# Patient Record
Sex: Female | Born: 1965 | Race: White | Hispanic: No | Marital: Married | Smoking: Never smoker
Health system: Southern US, Community
[De-identification: ages and names within clinical notes are randomized; demographics above are authoritative.]

## PROBLEM LIST (undated history)

## (undated) DIAGNOSIS — F419 Anxiety disorder, unspecified: Secondary | ICD-10-CM

## (undated) DIAGNOSIS — J309 Allergic rhinitis, unspecified: Secondary | ICD-10-CM

## (undated) DIAGNOSIS — I10 Essential (primary) hypertension: Secondary | ICD-10-CM

## (undated) DIAGNOSIS — E785 Hyperlipidemia, unspecified: Secondary | ICD-10-CM

## (undated) DIAGNOSIS — G4730 Sleep apnea, unspecified: Secondary | ICD-10-CM

## (undated) HISTORY — DX: Sleep apnea, unspecified: G47.30

## (undated) HISTORY — DX: Allergic rhinitis, unspecified: J30.9

## (undated) HISTORY — DX: Anxiety disorder, unspecified: F41.9

## (undated) HISTORY — DX: Essential (primary) hypertension: I10

## (undated) HISTORY — DX: Hyperlipidemia, unspecified: E78.5

## (undated) HISTORY — PX: DILATION AND CURETTAGE OF UTERUS: SHX78

---

## 2000-08-28 ENCOUNTER — Inpatient Hospital Stay (HOSPITAL_COMMUNITY): Admission: AD | Admit: 2000-08-28 | Discharge: 2000-08-31 | Payer: Self-pay | Admitting: Obstetrics and Gynecology

## 2006-11-01 ENCOUNTER — Ambulatory Visit (HOSPITAL_COMMUNITY): Admission: RE | Admit: 2006-11-01 | Discharge: 2006-11-01 | Payer: Self-pay | Admitting: Family Medicine

## 2006-11-16 ENCOUNTER — Ambulatory Visit (HOSPITAL_COMMUNITY): Admission: RE | Admit: 2006-11-16 | Discharge: 2006-11-16 | Payer: Self-pay | Admitting: Family Medicine

## 2008-01-29 ENCOUNTER — Ambulatory Visit (HOSPITAL_COMMUNITY): Admission: RE | Admit: 2008-01-29 | Discharge: 2008-01-29 | Payer: Self-pay | Admitting: Family Medicine

## 2009-01-29 ENCOUNTER — Ambulatory Visit (HOSPITAL_COMMUNITY): Admission: RE | Admit: 2009-01-29 | Discharge: 2009-01-29 | Payer: Self-pay | Admitting: Family Medicine

## 2010-02-03 ENCOUNTER — Ambulatory Visit (HOSPITAL_COMMUNITY)
Admission: RE | Admit: 2010-02-03 | Discharge: 2010-02-03 | Payer: Self-pay | Source: Home / Self Care | Attending: Family Medicine | Admitting: Family Medicine

## 2011-02-08 ENCOUNTER — Other Ambulatory Visit: Payer: Self-pay | Admitting: Family Medicine

## 2011-02-08 DIAGNOSIS — Z139 Encounter for screening, unspecified: Secondary | ICD-10-CM

## 2011-02-22 ENCOUNTER — Ambulatory Visit (HOSPITAL_COMMUNITY)
Admission: RE | Admit: 2011-02-22 | Discharge: 2011-02-22 | Disposition: A | Discharge status: Home / Self Care | Payer: BC Managed Care – PPO | Source: Ambulatory Visit | Attending: Family Medicine | Admitting: Family Medicine

## 2011-02-22 DIAGNOSIS — Z1231 Encounter for screening mammogram for malignant neoplasm of breast: Secondary | ICD-10-CM | POA: Insufficient documentation

## 2011-02-22 DIAGNOSIS — Z139 Encounter for screening, unspecified: Secondary | ICD-10-CM

## 2012-02-07 ENCOUNTER — Other Ambulatory Visit: Payer: Self-pay | Admitting: Family Medicine

## 2012-02-07 DIAGNOSIS — Z139 Encounter for screening, unspecified: Secondary | ICD-10-CM

## 2012-02-29 ENCOUNTER — Ambulatory Visit (HOSPITAL_COMMUNITY)
Admission: RE | Admit: 2012-02-29 | Discharge: 2012-02-29 | Disposition: A | Discharge status: Home / Self Care | Payer: BC Managed Care – PPO | Source: Ambulatory Visit | Attending: Family Medicine | Admitting: Family Medicine

## 2012-02-29 DIAGNOSIS — Z1231 Encounter for screening mammogram for malignant neoplasm of breast: Secondary | ICD-10-CM | POA: Insufficient documentation

## 2012-02-29 DIAGNOSIS — Z139 Encounter for screening, unspecified: Secondary | ICD-10-CM

## 2012-05-18 ENCOUNTER — Other Ambulatory Visit (HOSPITAL_COMMUNITY): Payer: Self-pay | Admitting: Family Medicine

## 2012-06-19 ENCOUNTER — Other Ambulatory Visit: Payer: Self-pay | Admitting: Family Medicine

## 2012-06-19 ENCOUNTER — Telehealth: Payer: Self-pay | Admitting: Nurse Practitioner

## 2012-06-19 DIAGNOSIS — G4733 Obstructive sleep apnea (adult) (pediatric): Secondary | ICD-10-CM

## 2012-06-19 NOTE — Telephone Encounter (Signed)
Patient would like to know if you could refer her for sleep study like you had talked about in the past.

## 2012-06-19 NOTE — Telephone Encounter (Signed)
Yes, i put the ball in motion, she will be calledd soon for aapt

## 2012-07-03 ENCOUNTER — Other Ambulatory Visit: Payer: Self-pay | Admitting: *Deleted

## 2012-07-03 DIAGNOSIS — G4730 Sleep apnea, unspecified: Secondary | ICD-10-CM

## 2012-07-04 ENCOUNTER — Other Ambulatory Visit (HOSPITAL_COMMUNITY): Payer: Self-pay | Admitting: Family Medicine

## 2012-07-05 NOTE — Telephone Encounter (Signed)
30 d only needs htn ov was supposed to see 6 mo ck up in [place]

## 2012-07-07 ENCOUNTER — Encounter: Payer: Self-pay | Admitting: *Deleted

## 2012-07-07 ENCOUNTER — Telehealth: Payer: Self-pay | Admitting: Family Medicine

## 2012-07-07 NOTE — Telephone Encounter (Signed)
Patient called to request a referral for a sleep study, however there's no documentation in paper or electronic chart as for the need of the sleep study or any symptoms. I left a message on patient's voicemail that she will need an office visit here to discuss concerns and need for sleep study before referral can be done.

## 2012-07-20 ENCOUNTER — Ambulatory Visit (INDEPENDENT_AMBULATORY_CARE_PROVIDER_SITE_OTHER): Payer: BC Managed Care – PPO | Admitting: Nurse Practitioner

## 2012-07-20 ENCOUNTER — Encounter: Payer: Self-pay | Admitting: Nurse Practitioner

## 2012-07-20 VITALS — BP 124/92 | HR 80 | Wt 175.2 lb

## 2012-07-20 DIAGNOSIS — Z79899 Other long term (current) drug therapy: Secondary | ICD-10-CM

## 2012-07-20 DIAGNOSIS — R5381 Other malaise: Secondary | ICD-10-CM

## 2012-07-20 DIAGNOSIS — E785 Hyperlipidemia, unspecified: Secondary | ICD-10-CM | POA: Insufficient documentation

## 2012-07-20 DIAGNOSIS — I10 Essential (primary) hypertension: Secondary | ICD-10-CM

## 2012-07-20 DIAGNOSIS — R5383 Other fatigue: Secondary | ICD-10-CM

## 2012-07-20 MED ORDER — FLUOXETINE HCL 20 MG PO TABS: ORAL_TABLET | ORAL | Status: DC

## 2012-07-20 MED ORDER — PRAVASTATIN SODIUM 20 MG PO TABS: 20.0000 mg | ORAL_TABLET | Freq: Every day | ORAL | Status: DC

## 2012-07-20 MED ORDER — AMLODIPINE BESYLATE 10 MG PO TABS: 10.0000 mg | ORAL_TABLET | Freq: Every day | ORAL | Status: DC

## 2012-07-20 NOTE — Progress Notes (Signed)
Subjective:  Presents for routine followup. Patient had an old CPAP machine that belonged to her father, used this for a period of time, noticed it greatly helped her energy. No further snoring. Had a mask with it which no longer had a good seal. Since stopping the CPAP has noticed her fatigue has increased as well as a report of loud snoring by her family. Is requesting a sleep study and possible CPAP. Compliant with medications. Has had increased stress lately, Prozac 20 mg does not seem to be working as well. No suicidal or homicidal thoughts or ideation. No chest pain shortness of breath. Minimal edema in the ankle area only when she's been outside with the heat.  Objective:   BP 124/92  Pulse 80  Wt 175 lb 3.2 oz (79.47 kg)  LMP 07/19/2012 NAD. Alert, oriented. Lungs clear. Heart regular rate rhythm.  Assessment:Hypertension - Plan: Basic metabolic panel, Basic metabolic panel  High risk medication use - Plan: Hepatic function panel, Hepatic function panel  Hyperlipemia - Plan: Lipid panel, Lipid panel  Other malaise and fatigue - Plan: TSH, Vitamin D 25 hydroxy, TSH, Vitamin D 25 hydroxy  Plan: Meds ordered this encounter  Medications  . DISCONTD: FLUoxetine (PROZAC) 20 MG tablet    Sig: Take 20 mg by mouth daily.  Marland Kitchen amLODipine (NORVASC) 10 MG tablet    Sig: Take 1 tablet (10 mg total) by mouth daily.    Dispense:  90 tablet    Refill:  1    Order Specific Question:  Supervising Provider    Answer:  Merlyn Albert [[id number]]  . FLUoxetine (PROZAC) 20 MG tablet    Sig: Take 2 capsules per day    Dispense:  180 tablet    Refill:  1    Order Specific Question:  Supervising Provider    Answer:  Merlyn Albert [[id number]]  . pravastatin (PRAVACHOL) 20 MG tablet    Sig: Take 1 tablet (20 mg total) by mouth daily.    Dispense:  90 tablet    Refill:  1    Order Specific Question:  Supervising Provider    Answer:  Merlyn Albert [[id number]]   Increase Prozac to 40 mg daily. Will look  into home base sleep study to see if this is an option, otherwise will set up sleep study at the sleep lab. Recheck in 3 months, recommend preventive health physical at that time.

## 2012-07-20 NOTE — Assessment & Plan Note (Signed)
Continue amlodipine 10 mg daily.

## 2012-07-20 NOTE — Assessment & Plan Note (Signed)
Continue pravastatin 20 mg daily. Lab work pending.

## 2012-07-24 ENCOUNTER — Other Ambulatory Visit: Payer: Self-pay | Admitting: Nurse Practitioner

## 2012-07-24 DIAGNOSIS — R0683 Snoring: Secondary | ICD-10-CM

## 2012-07-24 DIAGNOSIS — R5381 Other malaise: Secondary | ICD-10-CM

## 2012-08-06 ENCOUNTER — Ambulatory Visit: Payer: BC Managed Care – PPO | Attending: Family Medicine | Admitting: Sleep Medicine

## 2012-08-06 DIAGNOSIS — G4730 Sleep apnea, unspecified: Secondary | ICD-10-CM

## 2012-08-06 DIAGNOSIS — G4733 Obstructive sleep apnea (adult) (pediatric): Secondary | ICD-10-CM | POA: Insufficient documentation

## 2012-08-06 DIAGNOSIS — G4710 Hypersomnia, unspecified: Secondary | ICD-10-CM | POA: Insufficient documentation

## 2012-08-07 NOTE — Procedures (Signed)
[facility] Marin Milley A. Gerilyn Pilgrim, MD     [email or web address]        NAME:  Gabrielle Little, Gabrielle Little                ACCOUNT NO.:  [id number]  MEDICAL RECORD NO.:  [id number]          PATIENT TYPE:  OUT  LOCATION:  SLEEP LAB                     FACILITY:  [facility]  PHYSICIAN:  Rohith Fauth A. Gerilyn Pilgrim, M.D. DATE OF BIRTH:  August 09, 1965  DATE OF STUDY:                           NOCTURNAL POLYSOMNOGRAM  REFERRING PHYSICIAN:  Merlyn Albert  INDICATION:  A 47 year old, who presents with hypersomnia, fatigue, and snoring.  MEDICATIONS:  None.  EPWORTH SLEEPINESS SCALE:  10.  BMI:  30.  ARCHITECTURAL SUMMARY:  This is a split-night recording with initial portion being a diagnostic and the second portion a titration recording. The total recording time is 436, sleep efficiency 87%, sleep latency 18 minutes.  REM latency 123 minutes.  Stage N1 5%, N2 28%, N3 20%, and REM sleep 47%  RESPIRATORY SUMMARY:  Baseline oxygen saturation is 98, lowest saturation was 78 during REM sleep.  Diagnostic AHI is 51.  The patient was placed on positive pressures between 5 and 15, optimal pressure is 13 with resolution of events and good tolerance.  LIMB MOVEMENT SUMMARY:  PLM index 0.  ELECTROCARDIOGRAM SUMMARY:  Average heart rate is 76 with no significant dysrhythmias observed.  IMPRESSION:  Severe obstructive sleep apnea syndrome, which responds well to a continuous positive airway pressure of 13.  Thank you for this referral.     Nancylee Gaines A. Gerilyn Pilgrim, M.D.    KAD/MEDQ  D:  08/07/2012 09:02:30  T:  08/07/2012 09:23:59  Job:  [id number]

## 2012-08-18 LAB — LIPID PANEL
HDL: 53 mg/dL (ref >39)
LDL Cholesterol: 114 mg/dL — ABNORMAL HIGH (ref 0–99)
Total CHOL/HDL Ratio: 3.5 Ratio
VLDL: 21 mg/dL (ref 0–40)

## 2012-08-18 LAB — HEPATIC FUNCTION PANEL
Bilirubin, Direct: 0.2 mg/dL (ref 0.0–0.3)
Indirect Bilirubin: 0.6 mg/dL (ref 0.0–0.9)
Total Bilirubin: 0.8 mg/dL (ref 0.3–1.2)

## 2012-08-18 LAB — BASIC METABOLIC PANEL
BUN: 8 mg/dL (ref 6–23)
Creat: 0.65 mg/dL (ref 0.50–1.10)
Glucose, Bld: 89 mg/dL (ref 70–99)
Potassium: 4.2 mEq/L (ref 3.5–5.3)

## 2012-08-19 LAB — VITAMIN D 25 HYDROXY (VIT D DEFICIENCY, FRACTURES): Vit D, 25-Hydroxy: 33 ng/mL (ref 30–89)

## 2012-08-21 ENCOUNTER — Encounter: Payer: Self-pay | Admitting: Nurse Practitioner

## 2012-09-13 ENCOUNTER — Telehealth: Payer: Self-pay | Admitting: Family Medicine

## 2012-09-13 NOTE — Telephone Encounter (Signed)
Results on front of chart

## 2012-09-13 NOTE — Telephone Encounter (Signed)
Study shows very significant sleep apnea. Needs new machine and mask--see rx attached to chart. Carry to car [facility] to get the ball rolling.

## 2012-09-13 NOTE — Telephone Encounter (Signed)
Paitent is call wanting results from sleep study from 7/6 she states.She hadnt her anything yet.

## 2012-09-14 NOTE — Telephone Encounter (Signed)
Notified patient study shows very significant sleep apnea. Needs new machine and mask, RX upfront ready for pickup, Carry to car [facility] to get the ball rolling. Patient verbalized understanding.

## 2012-09-18 ENCOUNTER — Telehealth: Payer: Self-pay | Admitting: Family Medicine

## 2012-09-18 NOTE — Telephone Encounter (Signed)
I thought i wrote one, heres another

## 2012-09-18 NOTE — Telephone Encounter (Signed)
Pt needs a hand written script with the cpap setting written on it please, fax to [phone]

## 2012-09-18 NOTE — Telephone Encounter (Signed)
Order faxed to number provided

## 2012-10-20 ENCOUNTER — Ambulatory Visit: Payer: BC Managed Care – PPO | Admitting: Nurse Practitioner

## 2012-11-02 ENCOUNTER — Telehealth: Payer: Self-pay | Admitting: Family Medicine

## 2012-11-02 NOTE — Telephone Encounter (Signed)
Discussed with pt's husband.  

## 2012-11-02 NOTE — Telephone Encounter (Signed)
Has laryngitis and is coughing.  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  If you call in meds please call into [facility]

## 2012-11-02 NOTE — Telephone Encounter (Signed)
Laryngitis is almost always a viral illness. He typically takes 3-8 days to go away. He is starting to have high fevers chest congestion shortness of breath or significant wheezing then should be checked quickly here or ER. Allergy medication is fine but typically laryngitis has to run its course.

## 2012-12-21 ENCOUNTER — Ambulatory Visit (INDEPENDENT_AMBULATORY_CARE_PROVIDER_SITE_OTHER): Payer: BC Managed Care – PPO | Admitting: *Deleted

## 2012-12-21 DIAGNOSIS — Z23 Encounter for immunization: Secondary | ICD-10-CM

## 2013-01-22 ENCOUNTER — Ambulatory Visit (INDEPENDENT_AMBULATORY_CARE_PROVIDER_SITE_OTHER): Payer: BC Managed Care – PPO | Admitting: Nurse Practitioner

## 2013-01-22 ENCOUNTER — Encounter: Payer: Self-pay | Admitting: Nurse Practitioner

## 2013-01-22 VITALS — BP 138/94 | Ht 61.0 in | Wt 176.2 lb

## 2013-01-22 DIAGNOSIS — N938 Other specified abnormal uterine and vaginal bleeding: Secondary | ICD-10-CM

## 2013-01-22 DIAGNOSIS — I10 Essential (primary) hypertension: Secondary | ICD-10-CM

## 2013-01-22 DIAGNOSIS — E785 Hyperlipidemia, unspecified: Secondary | ICD-10-CM

## 2013-01-22 DIAGNOSIS — F329 Major depressive disorder, single episode, unspecified: Secondary | ICD-10-CM

## 2013-01-22 DIAGNOSIS — Z79899 Other long term (current) drug therapy: Secondary | ICD-10-CM

## 2013-01-22 DIAGNOSIS — N949 Unspecified condition associated with female genital organs and menstrual cycle: Secondary | ICD-10-CM

## 2013-01-22 MED ORDER — PRAVASTATIN SODIUM 20 MG PO TABS: 20.0000 mg | ORAL_TABLET | Freq: Every day | ORAL | Status: DC

## 2013-01-22 MED ORDER — AMLODIPINE BESYLATE 10 MG PO TABS: 10.0000 mg | ORAL_TABLET | Freq: Every day | ORAL | Status: DC

## 2013-01-22 MED ORDER — FLUOXETINE HCL 20 MG PO TABS: ORAL_TABLET | ORAL | Status: DC

## 2013-01-30 ENCOUNTER — Encounter: Payer: Self-pay | Admitting: Nurse Practitioner

## 2013-01-30 NOTE — Progress Notes (Signed)
Subjective:  Presents for routine followup. Weight has been stable. No chest pain shortness of breath or edema. Having irregular menstrual cycles usually very heavy for first 2 days. Is due for her preventive health physical. Depression stable on Prozac.  Objective:   BP 138/94  Ht 5\' 1"  (1.549 m)  Wt 176 lb 3.2 oz (79.924 kg)  BMI 33.31 kg/m2 NAD. Alert, oriented. Lungs clear. Heart regular rate rhythm. Lower extremities no edema.   Assessment:Hypertension  High risk medication use - Plan: Hepatic function panel  Hyperlipemia - Plan: Lipid panel  DUB (dysfunctional uterine bleeding) - Plan: CBC with Differential  Depression   Plan: Meds ordered this encounter  Medications  . amLODipine (NORVASC) 10 MG tablet    Sig: Take 1 tablet (10 mg total) by mouth daily.    Dispense:  90 tablet    Refill:  1    Order Specific Question:  Supervising Provider    Answer:  Merlyn Albert [[id number]]  . FLUoxetine (PROZAC) 20 MG tablet    Sig: Take 2 capsules per day    Dispense:  180 tablet    Refill:  1    Order Specific Question:  Supervising Provider    Answer:  Merlyn Albert [[id number]]  . pravastatin (PRAVACHOL) 20 MG tablet    Sig: Take 1 tablet (20 mg total) by mouth daily.    Dispense:  90 tablet    Refill:  1    Order Specific Question:  Supervising Provider    Answer:  Merlyn Albert [[id number]]   encouraged healthy diet regular activity and weight loss. Continue current medications as directed. Recheck in 3-4 months, reminded patient about wellness physical.

## 2013-01-30 NOTE — Assessment & Plan Note (Signed)
Medications  . amLODipine (NORVASC) 10 MG tablet    Sig: Take 1 tablet (10 mg total) by mouth daily.    Dispense:  90 tablet    Refill:  1    Order Specific Question:  Supervising Provider    Answer:  LUKING, WILLIAM S [[id number]]  . FLUoxetine (PROZAC) 20 MG tablet    Sig: Take 2 capsules per day    Dispense:  180 tablet    Refill:  1    Order Specific Question:  Supervising Provider    Answer:  LUKING, WILLIAM S [[id number]]  . pravastatin (PRAVACHOL) 20 MG tablet    Sig: Take 1 tablet (20 mg total) by mouth daily.    Dispense:  90 tablet    Refill:  1    Order Specific Question:  Supervising Provider    Answer:  LUKING, WILLIAM S [[id number]]   encouraged healthy diet regular activity and weight loss. Continue current medications as directed. Recheck in 3-4 months, reminded patient about wellness physical.  

## 2013-01-30 NOTE — Assessment & Plan Note (Signed)
Medications  . amLODipine (NORVASC) 10 MG tablet    Sig: Take 1 tablet (10 mg total) by mouth daily.    Dispense:  90 tablet    Refill:  1    Order Specific Question:  Supervising Provider    Answer:  Merlyn Albert [[id number]]  . FLUoxetine (PROZAC) 20 MG tablet    Sig: Take 2 capsules per day    Dispense:  180 tablet    Refill:  1    Order Specific Question:  Supervising Provider    Answer:  Merlyn Albert [[id number]]  . pravastatin (PRAVACHOL) 20 MG tablet    Sig: Take 1 tablet (20 mg total) by mouth daily.    Dispense:  90 tablet    Refill:  1    Order Specific Question:  Supervising Provider    Answer:  Merlyn Albert [[id number]]   encouraged healthy diet regular activity and weight loss. Continue current medications as directed. Recheck in 3-4 months, reminded patient about wellness physical.

## 2013-01-31 ENCOUNTER — Encounter: Payer: BC Managed Care – PPO | Admitting: Nurse Practitioner

## 2013-02-27 ENCOUNTER — Encounter: Payer: BC Managed Care – PPO | Admitting: Nurse Practitioner

## 2013-03-10 LAB — CBC WITH DIFFERENTIAL/PLATELET
Basophils Absolute: 0 10*3/uL (ref 0.0–0.1)
Basophils Relative: 0 % (ref 0–1)
Eosinophils Absolute: 0.2 10*3/uL (ref 0.0–0.7)
Eosinophils Relative: 2 % (ref 0–5)
HCT: 39.1 % (ref 36.0–46.0)
Hemoglobin: 13.3 g/dL (ref 12.0–15.0)
Lymphocytes Relative: 30 % (ref 12–46)
Lymphs Abs: 2.1 10*3/uL (ref 0.7–4.0)
MCH: 27.9 pg (ref 26.0–34.0)
MCHC: 34 g/dL (ref 30.0–36.0)
MCV: 82 fL (ref 78.0–100.0)
Monocytes Absolute: 0.6 10*3/uL (ref 0.1–1.0)
Monocytes Relative: 9 % (ref 3–12)
Neutro Abs: 4 10*3/uL (ref 1.7–7.7)
Neutrophils Relative %: 59 % (ref 43–77)
Platelets: 269 10*3/uL (ref 150–400)
RBC: 4.77 MIL/uL (ref 3.87–5.11)
RDW: 14.6 % (ref 11.5–15.5)
WBC: 6.8 10*3/uL (ref 4.0–10.5)

## 2013-03-10 LAB — LIPID PANEL
Cholesterol: 182 mg/dL (ref 0–200)
HDL: 63 mg/dL (ref >39)
LDL Cholesterol: 90 mg/dL (ref 0–99)
Total CHOL/HDL Ratio: 2.9 Ratio
Triglycerides: 145 mg/dL (ref <150)
VLDL: 29 mg/dL (ref 0–40)

## 2013-03-10 LAB — HEPATIC FUNCTION PANEL
ALT: 18 U/L (ref 0–35)
AST: 16 U/L (ref 0–37)
Albumin: 3.8 g/dL (ref 3.5–5.2)
Alkaline Phosphatase: 56 U/L (ref 39–117)
Bilirubin, Direct: 0.1 mg/dL (ref 0.0–0.3)
Indirect Bilirubin: 0.4 mg/dL (ref 0.2–1.2)
Total Bilirubin: 0.5 mg/dL (ref 0.2–1.2)
Total Protein: 6.2 g/dL (ref 6.0–8.3)

## 2013-03-12 ENCOUNTER — Encounter: Payer: BC Managed Care – PPO | Admitting: Nurse Practitioner

## 2013-03-12 ENCOUNTER — Encounter: Payer: Self-pay | Admitting: Nurse Practitioner

## 2013-04-03 ENCOUNTER — Encounter: Payer: BC Managed Care – PPO | Admitting: Nurse Practitioner

## 2013-04-24 ENCOUNTER — Other Ambulatory Visit: Payer: Self-pay | Admitting: Family Medicine

## 2013-04-24 DIAGNOSIS — Z1231 Encounter for screening mammogram for malignant neoplasm of breast: Secondary | ICD-10-CM

## 2013-05-07 ENCOUNTER — Encounter: Payer: BC Managed Care – PPO | Admitting: Nurse Practitioner

## 2013-05-10 ENCOUNTER — Ambulatory Visit (HOSPITAL_COMMUNITY)
Admission: RE | Admit: 2013-05-10 | Discharge: 2013-05-10 | Disposition: A | Discharge status: Home / Self Care | Payer: BC Managed Care – PPO | Source: Ambulatory Visit | Attending: Family Medicine | Admitting: Family Medicine

## 2013-05-10 DIAGNOSIS — Z1231 Encounter for screening mammogram for malignant neoplasm of breast: Secondary | ICD-10-CM | POA: Insufficient documentation

## 2013-05-14 ENCOUNTER — Encounter: Payer: Self-pay | Admitting: Nurse Practitioner

## 2013-05-14 ENCOUNTER — Ambulatory Visit (INDEPENDENT_AMBULATORY_CARE_PROVIDER_SITE_OTHER): Payer: BC Managed Care – PPO | Admitting: Nurse Practitioner

## 2013-05-14 VITALS — BP 134/96 | Ht 61.0 in | Wt 182.0 lb

## 2013-05-14 DIAGNOSIS — I10 Essential (primary) hypertension: Secondary | ICD-10-CM

## 2013-05-14 DIAGNOSIS — Z0000 Encounter for general adult medical examination without abnormal findings: Secondary | ICD-10-CM

## 2013-05-14 DIAGNOSIS — Z01419 Encounter for gynecological examination (general) (routine) without abnormal findings: Secondary | ICD-10-CM

## 2013-05-14 DIAGNOSIS — N951 Menopausal and female climacteric states: Secondary | ICD-10-CM

## 2013-05-14 MED ORDER — NORETHINDRONE 0.35 MG PO TABS: 1.0000 | ORAL_TABLET | Freq: Every day | ORAL | Status: DC

## 2013-05-15 ENCOUNTER — Encounter: Payer: Self-pay | Admitting: Nurse Practitioner

## 2013-05-15 DIAGNOSIS — N951 Menopausal and female climacteric states: Secondary | ICD-10-CM | POA: Insufficient documentation

## 2013-05-15 NOTE — Progress Notes (Signed)
   Subjective:    Patient ID: Gabrielle Little, female    DOB: 02-03-1965, 48 y.o.   MRN: [id number]  HPI Presents for wellness exam. Married, same sexual partner. Having irregular menstrual cycle with spotting in between. Had her mammogram in March. Regular vision and dental exams. Has not done very well with her diet. Minimal exercise.   Review of Systems  Constitutional: Negative for fever, activity change, appetite change and fatigue.  HENT: Negative for dental problem, ear pain, sinus pressure and sore throat.   Respiratory: Negative for cough, chest tightness, shortness of breath and wheezing.   Cardiovascular: Negative for chest pain and leg swelling.  Gastrointestinal: Negative for nausea, vomiting, abdominal pain, diarrhea, constipation and blood in stool.  Genitourinary: Positive for menstrual problem. Negative for dysuria, urgency, frequency, vaginal discharge, enuresis, difficulty urinating, genital sores and pelvic pain.       Objective:   Physical Exam  Vitals reviewed. Constitutional: She is oriented to person, place, and time. She appears well-developed. No distress.  HENT:  Right Ear: External ear normal.  Left Ear: External ear normal.  Mouth/Throat: Oropharynx is clear and moist.  Neck: Normal range of motion. Neck supple. No tracheal deviation present. No thyromegaly present.  Cardiovascular: Normal rate, regular rhythm and normal heart sounds.  Exam reveals no gallop.   No murmur heard. Pulmonary/Chest: Effort normal and breath sounds normal.  Abdominal: Soft. She exhibits no distension. There is no tenderness.  Genitourinary: Vagina normal and uterus normal. No vaginal discharge found.  External GU: No rashes or lesions noted. Vagina no discharge. Cervix normal limit in appearance, no CMT. Bimanual exam normal, limited due to abdominal girth.  Musculoskeletal: She exhibits no edema.  Lymphadenopathy:    She has no cervical adenopathy.  Neurological: She is alert  and oriented to person, place, and time.  Skin: Skin is warm and dry. No rash noted.  Psychiatric: She has a normal mood and affect. Her behavior is normal.   Breast exam: No masses. Axilla no adenopathy. See labs to 7:15       Assessment & Plan:   Problem List Items Addressed This Visit     Cardiovascular and Mediastinum   Hypertension     Other   Perimenopause    Other Visit Diagnoses   Well woman exam    -  Primary      Recheck BP outside office and call back if it remains over 140/90. Compliant with medications. Meds ordered this encounter  Medications  . norethindrone (MICRONOR,CAMILA,ERRIN) 0.35 MG tablet    Sig: Take 1 tablet (0.35 mg total) by mouth daily.    Dispense:  1 Package    Refill:  11    Order Specific Question:  Supervising Provider    Answer:  Merlyn AlbertLUKING, WILLIAM S [[id number]]   recheck in 6 months, call back sooner if any problems.  Discussed options regarding perimenopausal bleeding. Started on Micronor daily. Discussed potential adverse affects. DC med and call if any problems. Encourage weight loss, healthy diet and regular exercise.

## 2013-07-06 ENCOUNTER — Telehealth: Payer: Self-pay | Admitting: Nurse Practitioner

## 2013-07-06 NOTE — Telephone Encounter (Signed)
Patient was started on a low dose of birth control with just progesterone. She has actually had more periods (mild) since she has been on it. She said this is the 2nd this month. Please advise.

## 2013-07-06 NOTE — Telephone Encounter (Signed)
Patient stated she would like to try the pill for one more month to see if breakthrough bleeding stops and if it doesn't get better after the 3rd month se would call back and explore other options.

## 2013-07-06 NOTE — Telephone Encounter (Signed)
3 options: continue pill for one more month to see if period stops; add on a second med briefly in addition to pill to see if this will help; look at another choice such as IUD (Skyla or Mirena) or Nexplanon.

## 2013-07-25 ENCOUNTER — Other Ambulatory Visit: Payer: Self-pay | Admitting: Nurse Practitioner

## 2013-10-22 ENCOUNTER — Telehealth: Payer: Self-pay | Admitting: *Deleted

## 2013-10-22 NOTE — Telephone Encounter (Signed)
Just got a few minutes ago; will fill out today and send back

## 2013-10-22 NOTE — Telephone Encounter (Signed)
Calling to check on form for cpap. Last sleep study was 08/08/12. Last office visit 05/15/13

## 2013-10-23 NOTE — Telephone Encounter (Signed)
rx for cpap supplies faxed to [facility] fax #[phone] [facility] care team. Pt notified on voicemai.

## 2013-11-07 ENCOUNTER — Ambulatory Visit (INDEPENDENT_AMBULATORY_CARE_PROVIDER_SITE_OTHER): Payer: BC Managed Care – PPO | Admitting: *Deleted

## 2013-11-07 DIAGNOSIS — Z23 Encounter for immunization: Secondary | ICD-10-CM

## 2013-11-19 ENCOUNTER — Telehealth: Payer: Self-pay | Admitting: Family Medicine

## 2013-11-19 MED ORDER — TRIAMCINOLONE ACETONIDE 0.1 % EX CREA: 1.0000 "application " | TOPICAL_CREAM | Freq: Two times a day (BID) | CUTANEOUS | Status: DC

## 2013-11-19 NOTE — Telephone Encounter (Signed)
Patient has a razor irritation on her leg and its created a little rash.  She said she used to get a cream prescribed for this, but the drug store said its been so long, they don't have it on file. She wants to know if we can look up what this was and call it in.   [facility]

## 2013-11-19 NOTE — Telephone Encounter (Signed)
Rx sent electronically to pharmacy. Patient notified. 

## 2013-11-19 NOTE — Telephone Encounter (Signed)
triamcin .1 cr 15 g bid

## 2014-01-21 ENCOUNTER — Encounter: Payer: Self-pay | Admitting: Nurse Practitioner

## 2014-01-21 ENCOUNTER — Ambulatory Visit (INDEPENDENT_AMBULATORY_CARE_PROVIDER_SITE_OTHER): Payer: BC Managed Care – PPO | Admitting: Nurse Practitioner

## 2014-01-21 VITALS — BP 140/88 | Ht 61.0 in | Wt 175.0 lb

## 2014-01-21 DIAGNOSIS — N951 Menopausal and female climacteric states: Secondary | ICD-10-CM

## 2014-01-21 DIAGNOSIS — Z79899 Other long term (current) drug therapy: Secondary | ICD-10-CM

## 2014-01-21 DIAGNOSIS — E785 Hyperlipidemia, unspecified: Secondary | ICD-10-CM

## 2014-01-21 DIAGNOSIS — I10 Essential (primary) hypertension: Secondary | ICD-10-CM

## 2014-01-21 MED ORDER — AMLODIPINE BESYLATE 10 MG PO TABS: ORAL_TABLET | ORAL | Status: DC

## 2014-01-21 MED ORDER — PRAVASTATIN SODIUM 20 MG PO TABS: ORAL_TABLET | ORAL | Status: DC

## 2014-01-21 MED ORDER — FLUOXETINE HCL 20 MG PO CAPS: ORAL_CAPSULE | ORAL | Status: DC

## 2014-01-21 NOTE — Patient Instructions (Signed)
Beets (nitrous oxide foods)

## 2014-01-21 NOTE — Progress Notes (Signed)
Subjective:  Presents for recheck on her hypertension. Felt herself tense up as soon as she came into the office. Has had increased stress at home as well. 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 Patient and her husband plan to start activity and weight loss program after Christmas. Also taking daily Micronor; cycles are much better, having some light spotting about once a month no more than 4 days wearing a pantiliner. No chest pain shortness of breath or edema.  Objective:   BP 140/88 mmHg  Ht 5\' 1"  (1.549 m)  Wt 175 lb (79.379 kg)  BMI 33.08 kg/m2 NAD. Alert, oriented. Lungs clear. Heart regular rate rhythm. BP on recheck 140/88. Lower extremities no edema.  Assessment:  Problem List Items Addressed This Visit      Cardiovascular and Mediastinum   Hypertension - Primary   Relevant Medications      amLODIpine (NORVASC) tablet      pravastatin (PRAVACHOL) tablet   Other Relevant Orders      Basic metabolic panel     Other   Hyperlipemia   Relevant Medications      amLODIpine (NORVASC) tablet      pravastatin (PRAVACHOL) tablet   Other Relevant Orders      Lipid panel   Perimenopause    Other Visit Diagnoses    High risk medication use        Relevant Orders       Hepatic function panel      Plan:  Meds ordered this encounter  Medications  . amLODipine (NORVASC) 10 MG tablet    Sig: TAKE ONE (1) TABLET BY MOUTH EVERY DAY    Dispense:  90 tablet    Refill:  1    Order Specific Question:  Supervising Provider    Answer:  Merlyn AlbertLUKING, WILLIAM S [[id number]]  . FLUoxetine (PROZAC) 20 MG capsule    Sig: TAKE TWO CAPSULES EACH DAY    Dispense:  180 capsule    Refill:  1    Order Specific Question:  Supervising Provider    Answer:  Merlyn AlbertLUKING, WILLIAM S [[id number]]  . pravastatin (PRAVACHOL) 20 MG tablet    Sig: TAKE ONE (1) TABLET BY MOUTH EVERY DAY    Dispense:  90 tablet    Refill:  1    Order Specific Question:  Supervising Provider    Answer:  Riccardo DubinLUKING, WILLIAM S  [[id number]]   Encourage regular exercise healthy diet and weight loss. Return in about 6 months (around 07/23/2014).

## 2014-03-10 ENCOUNTER — Encounter: Payer: Self-pay | Admitting: Nurse Practitioner

## 2014-03-10 LAB — BASIC METABOLIC PANEL
BUN: 7 mg/dL (ref 6–23)
CHLORIDE: 105 meq/L (ref 96–112)
CO2: 26 mEq/L (ref 19–32)
Calcium: 8.8 mg/dL (ref 8.4–10.5)
Creat: 0.58 mg/dL (ref 0.50–1.10)
GLUCOSE: 88 mg/dL (ref 70–99)
Potassium: 4 mEq/L (ref 3.5–5.3)
SODIUM: 137 meq/L (ref 135–145)

## 2014-03-10 LAB — HEPATIC FUNCTION PANEL
ALBUMIN: 3.8 g/dL (ref 3.5–5.2)
ALT: 19 U/L (ref 0–35)
AST: 14 U/L (ref 0–37)
Alkaline Phosphatase: 52 U/L (ref 39–117)
Bilirubin, Direct: 0.1 mg/dL (ref 0.0–0.3)
Indirect Bilirubin: 0.6 mg/dL (ref 0.2–1.2)
TOTAL PROTEIN: 6.1 g/dL (ref 6.0–8.3)
Total Bilirubin: 0.7 mg/dL (ref 0.2–1.2)

## 2014-03-10 LAB — LIPID PANEL
Cholesterol: 168 mg/dL (ref 0–200)
HDL: 49 mg/dL (ref >39)
LDL Cholesterol: 103 mg/dL — ABNORMAL HIGH (ref 0–99)
Total CHOL/HDL Ratio: 3.4 Ratio
Triglycerides: 79 mg/dL (ref <150)
VLDL: 16 mg/dL (ref 0–40)

## 2014-04-16 ENCOUNTER — Other Ambulatory Visit: Payer: Self-pay | Admitting: Family Medicine

## 2014-04-16 ENCOUNTER — Other Ambulatory Visit: Payer: Self-pay | Admitting: Nurse Practitioner

## 2014-06-17 ENCOUNTER — Encounter: Payer: Self-pay | Admitting: Family Medicine

## 2014-06-17 ENCOUNTER — Ambulatory Visit (INDEPENDENT_AMBULATORY_CARE_PROVIDER_SITE_OTHER): Payer: BC Managed Care – PPO | Admitting: Family Medicine

## 2014-06-17 VITALS — BP 120/82 | Ht 61.0 in | Wt 179.0 lb

## 2014-06-17 DIAGNOSIS — R21 Rash and other nonspecific skin eruption: Secondary | ICD-10-CM | POA: Diagnosis not present

## 2014-06-17 MED ORDER — VALACYCLOVIR HCL 1 G PO TABS: 1000.0000 mg | ORAL_TABLET | Freq: Three times a day (TID) | ORAL | Status: DC

## 2014-06-17 NOTE — Progress Notes (Signed)
   Subjective:    Patient ID: Gabrielle Little, female    DOB: [birth year], 49 y.o.   MRN: [id number]  HPI Patient arrives with complaint of painful rash on right arm. ? shingles  Patient had chickenpox as a child.  Had burning and discomfort for a couple days prior to the emergency room of rash.  No known exposures directly that area.  Rashes painful   Review of Systems No headache no chest pain no cough no fever    Objective:   Physical Exam Alert vitals stable HEENT normal lungs clear. Heart regular in rhythm in her right arm cluster of erythematous papules early vesicles extremely tender       Assessment & Plan:  Impression probable shingles discussed plan Valtrex 1 g 3 times a day. Local measures discussed WSL

## 2014-06-17 NOTE — Patient Instructions (Signed)

## 2014-07-17 ENCOUNTER — Ambulatory Visit: Payer: BC Managed Care – PPO | Admitting: Nurse Practitioner

## 2014-07-18 ENCOUNTER — Ambulatory Visit: Payer: BC Managed Care – PPO | Admitting: Nurse Practitioner

## 2014-07-24 ENCOUNTER — Other Ambulatory Visit: Payer: Self-pay | Admitting: Nurse Practitioner

## 2014-07-26 ENCOUNTER — Ambulatory Visit (INDEPENDENT_AMBULATORY_CARE_PROVIDER_SITE_OTHER): Payer: BC Managed Care – PPO | Admitting: Nurse Practitioner

## 2014-07-26 ENCOUNTER — Encounter: Payer: Self-pay | Admitting: Nurse Practitioner

## 2014-07-26 VITALS — BP 138/88 | Ht 61.0 in | Wt 175.2 lb

## 2014-07-26 DIAGNOSIS — N951 Menopausal and female climacteric states: Secondary | ICD-10-CM

## 2014-07-26 DIAGNOSIS — E785 Hyperlipidemia, unspecified: Secondary | ICD-10-CM

## 2014-07-26 DIAGNOSIS — I10 Essential (primary) hypertension: Secondary | ICD-10-CM | POA: Diagnosis not present

## 2014-07-26 DIAGNOSIS — F419 Anxiety disorder, unspecified: Secondary | ICD-10-CM

## 2014-07-26 MED ORDER — PRAVASTATIN SODIUM 20 MG PO TABS: ORAL_TABLET | ORAL | Status: DC

## 2014-07-26 MED ORDER — AMLODIPINE BESYLATE 10 MG PO TABS: ORAL_TABLET | ORAL | Status: DC

## 2014-07-26 MED ORDER — HYDROCHLOROTHIAZIDE 25 MG PO TABS: 25.0000 mg | ORAL_TABLET | Freq: Every day | ORAL | Status: DC

## 2014-07-26 MED ORDER — NORETHINDRONE 0.35 MG PO TABS: ORAL_TABLET | ORAL | Status: DC

## 2014-07-26 MED ORDER — FLUOXETINE HCL 20 MG PO CAPS: ORAL_CAPSULE | ORAL | Status: DC

## 2014-07-29 ENCOUNTER — Encounter: Payer: Self-pay | Admitting: Nurse Practitioner

## 2014-07-29 NOTE — Progress Notes (Signed)
Subjective:  Presents for follow up on HTN. No CP/ischemic type pain or SOB. No edema. Very light to no menses on Micronor. Anxiety stable on Prozac.   Objective:   BP 138/88 mmHg  Ht 5\' 1"  (1.549 m)  Wt 175 lb 4 oz (79.493 kg)  BMI 33.13 kg/m2 NAD. Alert, oriented. Lungs clear. Heart RRR. Lower extremities no edema.   Assessment:  Problem List Items Addressed This Visit      Cardiovascular and Mediastinum   Hypertension - Primary   Relevant Medications   hydrochlorothiazide (HYDRODIURIL) 25 MG tablet   amLODipine (NORVASC) 10 MG tablet   pravastatin (PRAVACHOL) 20 MG tablet     Other   Anxiety   Relevant Medications   FLUoxetine (PROZAC) 20 MG capsule   Hyperlipemia   Relevant Medications   hydrochlorothiazide (HYDRODIURIL) 25 MG tablet   amLODipine (NORVASC) 10 MG tablet   pravastatin (PRAVACHOL) 20 MG tablet   Perimenopause      Plan:  Meds ordered this encounter  Medications  . hydrochlorothiazide (HYDRODIURIL) 25 MG tablet    Sig: Take 1 tablet (25 mg total) by mouth daily.    Dispense:  90 tablet    Refill:  1    Order Specific Question:  Supervising Provider    Answer:  Merlyn Albert [[id number]]  . amLODipine (NORVASC) 10 MG tablet    Sig: TAKE ONE (1) TABLET BY MOUTH EVERY DAY    Dispense:  90 tablet    Refill:  1    Order Specific Question:  Supervising Provider    Answer:  Merlyn Albert [[id number]]  . pravastatin (PRAVACHOL) 20 MG tablet    Sig: TAKE ONE (1) TABLET BY MOUTH EVERY DAY    Dispense:  90 tablet    Refill:  1    Order Specific Question:  Supervising Provider    Answer:  Merlyn Albert [[id number]]  . FLUoxetine (PROZAC) 20 MG capsule    Sig: TAKE TWO CAPSULES EACH DAY    Dispense:  180 capsule    Refill:  1    Order Specific Question:  Supervising Provider    Answer:  Merlyn Albert [[id number]]  . norethindrone (MICRONOR,CAMILA,ERRIN) 0.35 MG tablet    Sig: TAKE ONE (1) TABLET BY MOUTH EVERY DAY    Dispense:  3 Package    Refill:  3   Order Specific Question:  Supervising Provider    Answer:  Merlyn Albert [[id number]]   Recommend avoiding estrogen if possible. Since Micronor is working well, decided to continue. Recommend healthy diet, exercise and weight loss. Return in about 6 months (around 01/25/2015) for physical.

## 2014-09-09 ENCOUNTER — Telehealth: Payer: Self-pay | Admitting: Family Medicine

## 2014-09-09 NOTE — Telephone Encounter (Signed)
Error

## 2014-09-12 ENCOUNTER — Other Ambulatory Visit: Payer: Self-pay | Admitting: Family Medicine

## 2014-09-12 ENCOUNTER — Encounter: Payer: Self-pay | Admitting: Family Medicine

## 2014-09-12 ENCOUNTER — Ambulatory Visit (INDEPENDENT_AMBULATORY_CARE_PROVIDER_SITE_OTHER): Payer: BC Managed Care – PPO | Admitting: Family Medicine

## 2014-09-12 VITALS — BP 130/80 | Ht 61.0 in | Wt 178.8 lb

## 2014-09-12 DIAGNOSIS — M6580 Other synovitis and tenosynovitis, unspecified site: Secondary | ICD-10-CM

## 2014-09-12 DIAGNOSIS — M76891 Other specified enthesopathies of right lower limb, excluding foot: Secondary | ICD-10-CM

## 2014-09-12 DIAGNOSIS — Z139 Encounter for screening, unspecified: Secondary | ICD-10-CM

## 2014-09-12 DIAGNOSIS — Z1231 Encounter for screening mammogram for malignant neoplasm of breast: Secondary | ICD-10-CM

## 2014-09-12 MED ORDER — DICLOFENAC SODIUM 75 MG PO TBEC: 75.0000 mg | DELAYED_RELEASE_TABLET | Freq: Two times a day (BID) | ORAL | Status: DC

## 2014-09-12 NOTE — Progress Notes (Signed)
   Subjective:    Patient ID: Gabrielle Little, female    DOB: May 04, 1965, 49 y.o.   MRN: [id number]  Knee Pain  The incident occurred more than 1 week ago. There was no injury mechanism. The quality of the pain is described as stabbing. The pain is at a severity of 7/10. The pain is moderate. The pain has been fluctuating since onset. Pertinent negatives include no loss of motion, loss of sensation or numbness. It is unknown if a foreign body is present. The symptoms are aggravated by movement and weight bearing. She has tried NSAIDs for the symptoms. The treatment provided no relief.    Patient arrives with c/o right knee pain off and on all summer.  Review of Systems  Neurological: Negative for numbness.   at times feels like it's going to give way. There's been a couple of the causing severe pain in the knee she states at times it's hard to straighten her leg out been present for 6 months     Objective:   Physical Exam Ligaments appear stable soreness pain discomfort medial compartment lower leg normal ankle normal left leg normal       Assessment & Plan:  Probable right knee arthritis there is possibility of a meniscal tear I recommend anti-inflammatory also recommend orthopedic consultation. May end up needing injection or possible MRI

## 2014-09-16 ENCOUNTER — Ambulatory Visit (HOSPITAL_COMMUNITY)
Admission: RE | Admit: 2014-09-16 | Discharge: 2014-09-16 | Disposition: A | Discharge status: Home / Self Care | Payer: BC Managed Care – PPO | Source: Ambulatory Visit | Attending: Family Medicine | Admitting: Family Medicine

## 2014-09-16 DIAGNOSIS — Z1231 Encounter for screening mammogram for malignant neoplasm of breast: Secondary | ICD-10-CM | POA: Insufficient documentation

## 2014-10-03 ENCOUNTER — Ambulatory Visit (INDEPENDENT_AMBULATORY_CARE_PROVIDER_SITE_OTHER): Payer: BC Managed Care – PPO

## 2014-10-03 ENCOUNTER — Ambulatory Visit (INDEPENDENT_AMBULATORY_CARE_PROVIDER_SITE_OTHER): Payer: BC Managed Care – PPO | Admitting: Orthopedic Surgery

## 2014-10-03 VITALS — BP 133/83 | Ht 61.0 in | Wt 178.8 lb

## 2014-10-03 DIAGNOSIS — M25561 Pain in right knee: Secondary | ICD-10-CM | POA: Diagnosis not present

## 2014-10-03 DIAGNOSIS — M1711 Unilateral primary osteoarthritis, right knee: Secondary | ICD-10-CM | POA: Diagnosis not present

## 2014-10-03 NOTE — Patient Instructions (Signed)
CONTINUE DICLOFENAC STRENGTHENING EXERCISES

## 2014-10-05 ENCOUNTER — Encounter: Payer: Self-pay | Admitting: Orthopedic Surgery

## 2014-10-05 NOTE — Progress Notes (Signed)
Patient ID: Gabrielle Little, female   DOB: Nov 10, 1965, 49 y.o.   MRN: [id number] New   Chief Complaint  Patient presents with  . Knee Pain    right knee pain, REFER S LUKING     Gabrielle Little is a 49 y.o. female.   HPI 49 year old female patient of Dr. Lubertha South presents with a 6-8 month history of pain swelling and stiffness and a sensation that the right knee is giving out. The pain is sharp burning 3 out of 10 worse at night worse with activities especially climbing steps. Several episodes of giving way stiffness is been intermittent as has swelling. She initially took Advil and then diclofenac 75 mg which has given her significant symptomatic relief.  Review of systems excessive nighttime urination ankle leg swelling tingling left hand seasonal allergies joint pain in the right knee and a swollen right knee joint  GI endocrine constitutional symptoms have been negative   Review of Systems See hpi  Past Medical History  Diagnosis Date  . Hypertension   . Allergic rhinitis   . Anxiety   . Hyperlipidemia     Past Surgical History  Procedure Laterality Date  . Cesarean section      Family History  Problem Relation Age of Onset  . Hypertension Mother   . Osteoporosis Mother   . Heart attack Father 52    first MI  . Hypertension Father   . Diabetes Father   . Cancer Maternal Aunt     breast  . Hypertension Maternal Grandfather   . Diabetes Paternal Grandmother     Social History Social History  Substance Use Topics  . Smoking status: Never Smoker   . Smokeless tobacco: Never Used  . Alcohol Use: No    Allergies  Allergen Reactions  . Biaxin [Clarithromycin] Rash    Current Outpatient Prescriptions  Medication Sig Dispense Refill  . amLODipine (NORVASC) 10 MG tablet TAKE ONE (1) TABLET BY MOUTH EVERY DAY 90 tablet 1  . diclofenac (VOLTAREN) 75 MG EC tablet Take 1 tablet (75 mg total) by mouth 2 (two) times daily. 60 tablet 1  . FLUoxetine (PROZAC) 20 MG  capsule TAKE TWO CAPSULES EACH DAY 180 capsule 1  . hydrochlorothiazide (HYDRODIURIL) 25 MG tablet Take 1 tablet (25 mg total) by mouth daily. 90 tablet 1  . norethindrone (MICRONOR,CAMILA,ERRIN) 0.35 MG tablet TAKE ONE (1) TABLET BY MOUTH EVERY DAY 3 Package 3  . pravastatin (PRAVACHOL) 20 MG tablet TAKE ONE (1) TABLET BY MOUTH EVERY DAY 90 tablet 1  . triamcinolone cream (KENALOG) 0.1 % APPLY TWICE DAILY 15 g 0  . valACYclovir (VALTREX) 1000 MG tablet Take 1 tablet (1,000 mg total) by mouth 3 (three) times daily. 21 tablet 0   No current facility-administered medications for this visit.       Physical Exam Blood pressure 133/83, height 5\' 1"  (1.549 m), weight 178 lb 12.8 oz (81.103 kg), last menstrual period 09/02/2014. Physical Exam The patient is well developed well nourished and well groomed. Orientation to person place and time is normal  Mood is pleasant. Ambulatory status ambulates normal no assisted devices Right knee: Tenderness over the medial joint line no effusion. Full range of motion today. Knee stable in all planes. Motor exam shows quadriceps function grade 5. Skin is benign without rash, erythema or warmth. Neurovascular exam is intact good pulse normal sensation Meniscal signs such as McMurray's maneuver screw home test are equivocal at this point  Data Reviewed X-rays  interpreted as osteoarthritis mild to moderate  Assessment Encounter Diagnoses  Name Primary?  . Right knee pain   . Primary osteoarthritis of knee, right Yes    Plan Recommend continue diclofenac. Add knee exercises. Advise exercise control weight until symptoms worsen or change and then follow-up with [place] if that is the case

## 2014-11-29 ENCOUNTER — Ambulatory Visit: Payer: BC Managed Care – PPO

## 2014-12-13 ENCOUNTER — Ambulatory Visit (INDEPENDENT_AMBULATORY_CARE_PROVIDER_SITE_OTHER): Payer: BC Managed Care – PPO

## 2014-12-13 DIAGNOSIS — Z23 Encounter for immunization: Secondary | ICD-10-CM

## 2014-12-16 ENCOUNTER — Telehealth: Payer: Self-pay | Admitting: Family Medicine

## 2014-12-16 NOTE — Telephone Encounter (Signed)
Pt is needing a refill on her Symbicort sent to [facility]. Pt is needing 3 mths sent in for insurance reasons. Pt states that it is cheaper that way.

## 2014-12-16 NOTE — Telephone Encounter (Signed)
Wrong pt

## 2014-12-16 NOTE — Telephone Encounter (Signed)
Error wrong pt with similar name.

## 2015-01-16 ENCOUNTER — Other Ambulatory Visit: Payer: Self-pay | Admitting: Nurse Practitioner

## 2015-01-24 ENCOUNTER — Ambulatory Visit (INDEPENDENT_AMBULATORY_CARE_PROVIDER_SITE_OTHER): Payer: BC Managed Care – PPO | Admitting: Nurse Practitioner

## 2015-01-24 ENCOUNTER — Encounter: Payer: Self-pay | Admitting: Nurse Practitioner

## 2015-01-24 VITALS — BP 136/86 | Temp 98.2°F | Ht 61.0 in | Wt 178.4 lb

## 2015-01-24 DIAGNOSIS — J329 Chronic sinusitis, unspecified: Secondary | ICD-10-CM

## 2015-01-24 DIAGNOSIS — H66002 Acute suppurative otitis media without spontaneous rupture of ear drum, left ear: Secondary | ICD-10-CM

## 2015-01-24 MED ORDER — AMOXICILLIN-POT CLAVULANATE 875-125 MG PO TABS: 1.0000 | ORAL_TABLET | Freq: Two times a day (BID) | ORAL | Status: DC

## 2015-01-27 ENCOUNTER — Encounter: Payer: Self-pay | Admitting: Nurse Practitioner

## 2015-01-27 NOTE — Progress Notes (Signed)
Subjective:  Presents for c/o hoarseness x 4 days. No fever. Ethmoid area headache. Scratchy throat. Occasional cough. No wheezing. Bilateral ear pain. Taking fluids well. Voiding nl.   Objective:   BP 136/86 mmHg  Temp(Src) 98.2 F (36.8 C) (Oral)  Ht 5\' 1"  (1.549 m)  Wt 178 lb 6 oz (80.91 kg)  BMI 33.72 kg/m2 NAD. Alert, oriented. Rt TM: clear effusion. Lt TM: dull with moderate erythema. Pharynx injected with PND noted. Neck supple with mild anterior adenopathy. Lungs clear. Heart RRR.   Assessment: Rhinosinusitis  Acute suppurative otitis media of left ear without spontaneous rupture of tympanic membrane, recurrence not specified  Plan:  Meds ordered this encounter  Medications  . amoxicillin-clavulanate (AUGMENTIN) 875-125 MG tablet    Sig: Take 1 tablet by mouth 2 (two) times daily.    Dispense:  20 tablet    Refill:  0    Order Specific Question:  Supervising Provider    Answer:  Merlyn AlbertLUKING, WILLIAM S [[id number]]   OTC meds as directed. Call back in 4 days if no improvement, sooner if worse. Reminded about preventive health physical.

## 2015-01-28 ENCOUNTER — Telehealth: Payer: Self-pay | Admitting: Family Medicine

## 2015-01-28 ENCOUNTER — Other Ambulatory Visit: Payer: Self-pay | Admitting: *Deleted

## 2015-01-28 MED ORDER — FLUCONAZOLE 150 MG PO TABS: ORAL_TABLET | ORAL | Status: DC

## 2015-01-28 NOTE — Telephone Encounter (Signed)
Patient was prescribed an antibiotic last week which has now caused her to have a yeast infection.  She wants to know if we can call something in for her. Please advise.  [facility]

## 2015-01-28 NOTE — Telephone Encounter (Signed)
Vaginal itching and white discharge. Diflucan sent to pharm. Pt notified.

## 2015-02-07 ENCOUNTER — Other Ambulatory Visit: Payer: Self-pay | Admitting: Nurse Practitioner

## 2015-02-12 ENCOUNTER — Other Ambulatory Visit: Payer: Self-pay | Admitting: Nurse Practitioner

## 2015-02-13 ENCOUNTER — Telehealth: Payer: Self-pay | Admitting: Nurse Practitioner

## 2015-02-13 NOTE — Telephone Encounter (Signed)
I refilled patient's meds but she is due for an office visit and labs. Please ask her to schedule an appointment; I recommend getting labs done about a week before so we can discuss during visit. Thanks.

## 2015-02-13 NOTE — Telephone Encounter (Signed)
LMRC 02/13/15 

## 2015-02-13 NOTE — Telephone Encounter (Signed)
Appt scheduled for 03/14/15, just need lab orders

## 2015-02-13 NOTE — Telephone Encounter (Signed)
Discussed with patient. Patient scheduled office visit and would like you to please order labs

## 2015-02-14 ENCOUNTER — Other Ambulatory Visit: Payer: Self-pay | Admitting: Nurse Practitioner

## 2015-02-14 DIAGNOSIS — Z139 Encounter for screening, unspecified: Secondary | ICD-10-CM

## 2015-02-14 DIAGNOSIS — I10 Essential (primary) hypertension: Secondary | ICD-10-CM

## 2015-02-14 DIAGNOSIS — E785 Hyperlipidemia, unspecified: Secondary | ICD-10-CM

## 2015-02-14 NOTE — Telephone Encounter (Signed)
done

## 2015-02-14 NOTE — Telephone Encounter (Signed)
LMRC 02/14/15 

## 2015-03-14 ENCOUNTER — Ambulatory Visit: Payer: BC Managed Care – PPO | Admitting: Nurse Practitioner

## 2015-03-17 ENCOUNTER — Ambulatory Visit: Payer: BC Managed Care – PPO | Admitting: Family Medicine

## 2015-03-18 LAB — LIPID PANEL
CHOLESTEROL TOTAL: 169 mg/dL (ref 100–199)
Chol/HDL Ratio: 3.3 ratio units (ref 0.0–4.4)
HDL: 52 mg/dL (ref >39)
LDL Calculated: 92 mg/dL (ref 0–99)
Triglycerides: 127 mg/dL (ref 0–149)
VLDL Cholesterol Cal: 25 mg/dL (ref 5–40)

## 2015-03-18 LAB — HEPATIC FUNCTION PANEL
ALBUMIN: 4.1 g/dL (ref 3.5–5.5)
ALT: 20 IU/L (ref 0–32)
AST: 12 IU/L (ref 0–40)
Alkaline Phosphatase: 56 IU/L (ref 39–117)
Bilirubin Total: 0.5 mg/dL (ref 0.0–1.2)
Bilirubin, Direct: 0.15 mg/dL (ref 0.00–0.40)
TOTAL PROTEIN: 6.3 g/dL (ref 6.0–8.5)

## 2015-03-18 LAB — BASIC METABOLIC PANEL
BUN / CREAT RATIO: 16 (ref 9–23)
BUN: 10 mg/dL (ref 6–24)
CHLORIDE: 100 mmol/L (ref 96–106)
CO2: 25 mmol/L (ref 18–29)
CREATININE: 0.61 mg/dL (ref 0.57–1.00)
Calcium: 8.9 mg/dL (ref 8.7–10.2)
GFR calc Af Amer: 122 mL/min/{1.73_m2} (ref >59)
GFR calc non Af Amer: 106 mL/min/{1.73_m2} (ref >59)
GLUCOSE: 87 mg/dL (ref 65–99)
Potassium: 3.5 mmol/L (ref 3.5–5.2)
SODIUM: 141 mmol/L (ref 134–144)

## 2015-03-18 LAB — VITAMIN D 25 HYDROXY (VIT D DEFICIENCY, FRACTURES): VIT D 25 HYDROXY: 26.3 ng/mL — AB (ref 30.0–100.0)

## 2015-03-24 ENCOUNTER — Ambulatory Visit (INDEPENDENT_AMBULATORY_CARE_PROVIDER_SITE_OTHER): Payer: BC Managed Care – PPO | Admitting: Nurse Practitioner

## 2015-03-24 ENCOUNTER — Encounter: Payer: Self-pay | Admitting: Nurse Practitioner

## 2015-03-24 VITALS — BP 138/84 | Ht 61.0 in | Wt 179.1 lb

## 2015-03-24 DIAGNOSIS — N951 Menopausal and female climacteric states: Secondary | ICD-10-CM

## 2015-03-24 DIAGNOSIS — I10 Essential (primary) hypertension: Secondary | ICD-10-CM | POA: Diagnosis not present

## 2015-03-24 DIAGNOSIS — E785 Hyperlipidemia, unspecified: Secondary | ICD-10-CM

## 2015-03-24 NOTE — Progress Notes (Signed)
Subjective:  Presents for routine follow up. Limited exercise. Doing well on oc's; once a month spotting. No CP/ischemic type pain or SOB. Edema much improved on HCTZ.   Objective:   BP 138/84 mmHg  Ht  (1.549 m)  Wt 179 lb 2 oz (81.251 kg)  BMI 33.86 kg/m2 NAD. Alert, oriented. Lungs clear. Heart RRR. Lower extremities trace edema. Reviewed lab results with patient from 03/17/15.  Assessment:  Problem List Items Addressed This Visit      Cardiovascular and Mediastinum   Hypertension - Primary     Other   Hyperlipemia   Perimenopause     Plan: start OTC vitamin D as directed. Recommend preventive health physical.  Return in about 6 months (around 09/21/2015) for recheck.

## 2015-04-17 ENCOUNTER — Other Ambulatory Visit: Payer: Self-pay | Admitting: Nurse Practitioner

## 2015-05-06 ENCOUNTER — Telehealth: Payer: Self-pay | Admitting: Nurse Practitioner

## 2015-05-06 ENCOUNTER — Other Ambulatory Visit: Payer: Self-pay | Admitting: *Deleted

## 2015-05-06 MED ORDER — HYDROCHLOROTHIAZIDE 25 MG PO TABS: 25.0000 mg | ORAL_TABLET | Freq: Every day | ORAL | Status: DC

## 2015-05-06 MED ORDER — FLUOXETINE HCL 20 MG PO CAPS: ORAL_CAPSULE | ORAL | Status: DC

## 2015-05-06 MED ORDER — NORETHINDRONE 0.35 MG PO TABS: ORAL_TABLET | ORAL | Status: DC

## 2015-05-06 MED ORDER — AMLODIPINE BESYLATE 10 MG PO TABS: ORAL_TABLET | ORAL | Status: DC

## 2015-05-06 MED ORDER — PRAVASTATIN SODIUM 20 MG PO TABS: ORAL_TABLET | ORAL | Status: DC

## 2015-05-06 NOTE — Telephone Encounter (Signed)
pts spouse is calling to say his wife has tried to call in her daily meds  But he didn't know which ones, that the Pharmacy is telling her she needs An OV however she was seen on 2/20 for her reg med check   Please advise if you can   [facility]

## 2015-05-06 NOTE — Telephone Encounter (Signed)
meds sent to pharm. Pt notified.  

## 2015-09-03 ENCOUNTER — Other Ambulatory Visit: Payer: Self-pay | Admitting: Family Medicine

## 2015-09-03 DIAGNOSIS — Z1231 Encounter for screening mammogram for malignant neoplasm of breast: Secondary | ICD-10-CM

## 2015-09-10 ENCOUNTER — Encounter: Payer: BC Managed Care – PPO | Admitting: Nurse Practitioner

## 2015-09-26 ENCOUNTER — Ambulatory Visit (HOSPITAL_COMMUNITY)
Admission: RE | Admit: 2015-09-26 | Discharge: 2015-09-26 | Disposition: A | Discharge status: Home / Self Care | Payer: BC Managed Care – PPO | Source: Ambulatory Visit | Attending: Family Medicine | Admitting: Family Medicine

## 2015-09-26 DIAGNOSIS — Z1231 Encounter for screening mammogram for malignant neoplasm of breast: Secondary | ICD-10-CM | POA: Insufficient documentation

## 2015-10-21 ENCOUNTER — Other Ambulatory Visit: Payer: Self-pay | Admitting: Family Medicine

## 2015-11-17 ENCOUNTER — Other Ambulatory Visit: Payer: Self-pay | Admitting: Family Medicine

## 2015-11-27 ENCOUNTER — Ambulatory Visit: Payer: BC Managed Care – PPO

## 2015-12-03 ENCOUNTER — Other Ambulatory Visit: Payer: Self-pay | Admitting: Family Medicine

## 2015-12-12 ENCOUNTER — Encounter: Payer: Self-pay | Admitting: Nurse Practitioner

## 2015-12-12 ENCOUNTER — Ambulatory Visit (INDEPENDENT_AMBULATORY_CARE_PROVIDER_SITE_OTHER): Payer: BC Managed Care – PPO | Admitting: Nurse Practitioner

## 2015-12-12 VITALS — BP 136/84 | Ht 61.0 in | Wt 185.0 lb

## 2015-12-12 DIAGNOSIS — I10 Essential (primary) hypertension: Secondary | ICD-10-CM

## 2015-12-12 DIAGNOSIS — Z23 Encounter for immunization: Secondary | ICD-10-CM

## 2015-12-12 DIAGNOSIS — N951 Menopausal and female climacteric states: Secondary | ICD-10-CM | POA: Diagnosis not present

## 2015-12-12 DIAGNOSIS — E785 Hyperlipidemia, unspecified: Secondary | ICD-10-CM | POA: Diagnosis not present

## 2015-12-12 DIAGNOSIS — F419 Anxiety disorder, unspecified: Secondary | ICD-10-CM

## 2015-12-12 MED ORDER — NORETHINDRONE 0.35 MG PO TABS: ORAL_TABLET | ORAL | 1 refills | Status: DC

## 2015-12-12 MED ORDER — PRAVASTATIN SODIUM 20 MG PO TABS: ORAL_TABLET | ORAL | 1 refills | Status: DC

## 2015-12-12 MED ORDER — HYDROCHLOROTHIAZIDE 25 MG PO TABS: ORAL_TABLET | ORAL | 1 refills | Status: DC

## 2015-12-12 MED ORDER — FLUOXETINE HCL 40 MG PO CAPS: 40.0000 mg | ORAL_CAPSULE | Freq: Every day | ORAL | 1 refills | Status: DC

## 2015-12-12 MED ORDER — AMLODIPINE BESYLATE 10 MG PO TABS: ORAL_TABLET | ORAL | 1 refills | Status: DC

## 2015-12-13 ENCOUNTER — Encounter: Payer: Self-pay | Admitting: Nurse Practitioner

## 2015-12-13 NOTE — Progress Notes (Signed)
Subjective:  Presents for routine follow up. Compliant with meds. No CP/ischemic type pain or SOB. Anxiety stable on Prozac. Doing better with diet but still struggling with weight. Walking at work is her main activity. Minimal spotting or bleeding on Micronor. Would like to continue.   Objective:   BP 136/84   Ht 5\' 1"  (1.549 m)   Wt 185 lb (83.9 kg)   BMI 34.96 kg/m  NAD. Alert, oriented. Lungs clear. Heart RRR. Lower extremities no edema. Significant central obesity.   Assessment:  Problem List Items Addressed This Visit      Cardiovascular and Mediastinum   Hypertension - Primary   Relevant Medications   amLODipine (NORVASC) 10 MG tablet   hydrochlorothiazide (HYDRODIURIL) 25 MG tablet   pravastatin (PRAVACHOL) 20 MG tablet     Other   Anxiety   Relevant Medications   FLUoxetine (PROZAC) 40 MG capsule   Hyperlipemia   Relevant Medications   amLODipine (NORVASC) 10 MG tablet   hydrochlorothiazide (HYDRODIURIL) 25 MG tablet   pravastatin (PRAVACHOL) 20 MG tablet   Perimenopause    Other Visit Diagnoses    Need for vaccination       Relevant Orders   Flu Vaccine QUAD 36+ mos IM (Completed)     Plan:  Meds ordered this encounter  Medications  . amLODipine (NORVASC) 10 MG tablet    Sig: TAKE ONE (1) TABLET EACH DAY    Dispense:  90 tablet    Refill:  1    Order Specific Question:   Supervising Provider    Answer:   Merlyn AlbertLUKING, WILLIAM S [[id number]]  . hydrochlorothiazide (HYDRODIURIL) 25 MG tablet    Sig: TAKE ONE (1) TABLET BY MOUTH EVERY DAY    Dispense:  90 tablet    Refill:  1    Order Specific Question:   Supervising Provider    Answer:   Merlyn AlbertLUKING, WILLIAM S [[id number]]  . pravastatin (PRAVACHOL) 20 MG tablet    Sig: TAKE ONE TABLET BY MOUTH AFTER EVENING MEAL    Dispense:  90 tablet    Refill:  1    Order Specific Question:   Supervising Provider    Answer:   Merlyn AlbertLUKING, WILLIAM S [[id number]]  . norethindrone (MICRONOR,CAMILA,ERRIN) 0.35 MG tablet    Sig: TAKE ONE (1) TABLET BY  MOUTH EVERY DAY    Dispense:  84 tablet    Refill:  1    Order Specific Question:   Supervising Provider    Answer:   Merlyn AlbertLUKING, WILLIAM S [[id number]]  . FLUoxetine (PROZAC) 40 MG capsule    Sig: Take 1 capsule (40 mg total) by mouth daily.    Dispense:  90 capsule    Refill:  1    Order Specific Question:   Supervising Provider    Answer:   Merlyn AlbertLUKING, WILLIAM S [[id number]]   Encouraged weight loss and regular activity. Return in about 6 months (around 06/10/2016) for physical in Dec.  Patient to call for labs before her physical.

## 2016-01-12 ENCOUNTER — Other Ambulatory Visit: Payer: Self-pay | Admitting: Family Medicine

## 2016-01-12 NOTE — Telephone Encounter (Signed)
Dr steves 

## 2016-01-22 ENCOUNTER — Encounter: Payer: Self-pay | Admitting: Nurse Practitioner

## 2016-01-22 ENCOUNTER — Ambulatory Visit (INDEPENDENT_AMBULATORY_CARE_PROVIDER_SITE_OTHER): Payer: BC Managed Care – PPO | Admitting: Nurse Practitioner

## 2016-01-22 VITALS — BP 120/80 | Ht 61.5 in | Wt 185.5 lb

## 2016-01-22 DIAGNOSIS — E559 Vitamin D deficiency, unspecified: Secondary | ICD-10-CM

## 2016-01-22 DIAGNOSIS — R5383 Other fatigue: Secondary | ICD-10-CM

## 2016-01-22 DIAGNOSIS — Z124 Encounter for screening for malignant neoplasm of cervix: Secondary | ICD-10-CM

## 2016-01-22 DIAGNOSIS — Z1151 Encounter for screening for human papillomavirus (HPV): Secondary | ICD-10-CM

## 2016-01-22 DIAGNOSIS — E785 Hyperlipidemia, unspecified: Secondary | ICD-10-CM | POA: Diagnosis not present

## 2016-01-22 DIAGNOSIS — Z0000 Encounter for general adult medical examination without abnormal findings: Secondary | ICD-10-CM | POA: Diagnosis not present

## 2016-01-22 DIAGNOSIS — I10 Essential (primary) hypertension: Secondary | ICD-10-CM

## 2016-01-22 DIAGNOSIS — Z79899 Other long term (current) drug therapy: Secondary | ICD-10-CM | POA: Diagnosis not present

## 2016-01-22 NOTE — Progress Notes (Signed)
   Subjective:    Patient ID: Gabrielle Little, female    DOB: May 29, 1965, 50 y.o.   MRN: [id number]  HPI presents for her wellness exam. Still on Micronor. Light flow at times. Same sexual partner. Active at work. Regular vision and dental exams.     Review of Systems  Constitutional: Positive for fatigue. Negative for activity change and appetite change.  HENT: Negative for dental problem, ear pain, sinus pressure and sore throat.   Respiratory: Negative for cough, chest tightness, shortness of breath and wheezing.   Cardiovascular: Negative for chest pain.  Gastrointestinal: Negative for abdominal distention, abdominal pain, blood in stool, constipation, diarrhea, nausea and vomiting.  Genitourinary: Negative for difficulty urinating, dysuria, enuresis, frequency, genital sores, menstrual problem, pelvic pain, urgency and vaginal discharge.       Objective:   Physical Exam  Constitutional: She is oriented to person, place, and time. She appears well-developed. No distress.  HENT:  Right Ear: External ear normal.  Left Ear: External ear normal.  Mouth/Throat: Oropharynx is clear and moist.  Neck: Normal range of motion. Neck supple. No tracheal deviation present. No thyromegaly present.  Cardiovascular: Normal rate, regular rhythm and normal heart sounds.  Exam reveals no gallop.   No murmur heard. Pulmonary/Chest: Effort normal and breath sounds normal.  Abdominal: Soft. She exhibits no distension. There is no tenderness.  Genitourinary: Vagina normal and uterus normal. No vaginal discharge found.  Genitourinary Comments: External GU: no rashes or lesions. Vagina: no discharge. Cervix: slight scarring at os; mild spotting with PAP. No CMT. Bimanual exam: no tenderness or obvious masses. Rectal exam: no masses; no stool for hemoccult.   Musculoskeletal: She exhibits no edema.  Lymphadenopathy:    She has no cervical adenopathy.  Neurological: She is alert and oriented to person,  place, and time.  Skin: Skin is warm and dry. No rash noted.  Psychiatric: She has a normal mood and affect. Her behavior is normal.  Vitals reviewed. Breast exam: no masses; axillae no adenopathy.        Assessment & Plan:   Problem List Items Addressed This Visit      Cardiovascular and Mediastinum   Hypertension     Other   Hyperlipemia   Relevant Orders   Lipid panel    Other Visit Diagnoses    Routine general medical examination at a health care facility    -  Primary   Relevant Orders   Pap IG and HPV (high risk) DNA detection   Screening for cervical cancer       Relevant Orders   Pap IG and HPV (high risk) DNA detection   Screening for HPV (human papillomavirus)       Relevant Orders   Pap IG and HPV (high risk) DNA detection   High risk medication use       Relevant Orders   Basic metabolic panel   Hepatic function panel   Other fatigue       Relevant Orders   CBC with Differential/Platelet   Vitamin D insufficiency       Relevant Orders   VITAMIN D 25 Hydroxy (Vit-D Deficiency, Fractures)     Continue to work on weight loss. Plans colonoscopy this summer. Labs pending.  Return in about 6 months (around 07/22/2016) for routine follow up.

## 2016-01-28 ENCOUNTER — Encounter: Payer: BC Managed Care – PPO | Admitting: Nurse Practitioner

## 2016-01-28 LAB — PAP IG AND HPV HIGH-RISK
HPV, HIGH-RISK: POSITIVE — AB
PAP SMEAR COMMENT: 0

## 2016-02-05 ENCOUNTER — Encounter: Payer: Self-pay | Admitting: Nurse Practitioner

## 2016-02-05 DIAGNOSIS — B977 Papillomavirus as the cause of diseases classified elsewhere: Secondary | ICD-10-CM | POA: Insufficient documentation

## 2016-02-06 ENCOUNTER — Telehealth: Payer: Self-pay | Admitting: Nurse Practitioner

## 2016-02-06 MED ORDER — FLUCONAZOLE 150 MG PO TABS: 150.0000 mg | ORAL_TABLET | Freq: Every day | ORAL | 0 refills | Status: DC

## 2016-02-06 NOTE — Telephone Encounter (Signed)
Patient states she is having vaginal itching with just a little bit of discharge. Diflucan 150 mg sent to pharmacy per protocol. Patient wants Eber JonesCarolyn to call her back because she has a question concerning her pap smear. She only wants to talk to [place].

## 2016-02-06 NOTE — Telephone Encounter (Signed)
Patient has a yeast infection and is requesting a medication to be called in.  Also, she is requesting Gabrielle Little call her to discuss pap smear results.   [facility]

## 2016-02-12 ENCOUNTER — Telehealth: Payer: Self-pay | Admitting: Nurse Practitioner

## 2016-02-12 NOTE — Telephone Encounter (Signed)
Pt is wanting Gabrielle JonesCarolyn to call her regarding her recent pap smear she has a couple questions regarding the results.

## 2016-02-12 NOTE — Telephone Encounter (Signed)
I spoke with patient in regards to her pap 01/22/16.  She had a few questions about results, I answered those.  One question I could not answer is, was HPV present on paps previous to this year's.  I did explain the testing method/process had changes and it is possible she was not previously tested.  She is requesting we look at her previous results.  I do not see them in the computer. She as adamite she saw you for them. Please advise.

## 2016-02-13 NOTE — Telephone Encounter (Signed)
LMTCB

## 2016-02-13 NOTE — Telephone Encounter (Signed)
I pulled her paper chart. All of her PAPs have been normal that I have done since 2008. We just recently started testing for HPV. If HPV neg, we do PAP every 3 years; if pos, we recommend yearly. We did not do HPV tests with the other PAPs since it was not part of the guidelines at that time.

## 2016-02-23 NOTE — Telephone Encounter (Signed)
Left message to return call 

## 2016-02-23 NOTE — Telephone Encounter (Signed)
Discussed with pt. Pt verbalized understanding.  °

## 2016-06-25 ENCOUNTER — Other Ambulatory Visit: Payer: Self-pay | Admitting: Nurse Practitioner

## 2016-06-29 NOTE — Telephone Encounter (Signed)
Make sure has six mo f u sched with carolyn first, if not sched one, due in June. Then may ref times one

## 2016-06-29 NOTE — Telephone Encounter (Signed)
Patient seen December 2017

## 2016-07-17 ENCOUNTER — Other Ambulatory Visit: Payer: Self-pay | Admitting: Nurse Practitioner

## 2016-07-31 ENCOUNTER — Other Ambulatory Visit: Payer: Self-pay | Admitting: Nurse Practitioner

## 2016-08-09 ENCOUNTER — Ambulatory Visit: Payer: BC Managed Care – PPO | Admitting: Nurse Practitioner

## 2016-09-02 ENCOUNTER — Ambulatory Visit: Payer: BC Managed Care – PPO | Admitting: Nurse Practitioner

## 2016-09-06 ENCOUNTER — Ambulatory Visit: Payer: BC Managed Care – PPO | Admitting: Nurse Practitioner

## 2016-09-07 ENCOUNTER — Ambulatory Visit (INDEPENDENT_AMBULATORY_CARE_PROVIDER_SITE_OTHER): Payer: BC Managed Care – PPO | Admitting: *Deleted

## 2016-09-07 DIAGNOSIS — Z111 Encounter for screening for respiratory tuberculosis: Secondary | ICD-10-CM | POA: Diagnosis not present

## 2016-09-09 LAB — TB SKIN TEST
Induration: 0 mm
TB Skin Test: NEGATIVE

## 2016-09-17 ENCOUNTER — Ambulatory Visit (INDEPENDENT_AMBULATORY_CARE_PROVIDER_SITE_OTHER): Payer: BC Managed Care – PPO | Admitting: Nurse Practitioner

## 2016-09-17 ENCOUNTER — Encounter: Payer: Self-pay | Admitting: Nurse Practitioner

## 2016-09-17 VITALS — BP 128/82 | Ht 61.5 in | Wt 184.6 lb

## 2016-09-17 DIAGNOSIS — I10 Essential (primary) hypertension: Secondary | ICD-10-CM

## 2016-09-17 DIAGNOSIS — R5383 Other fatigue: Secondary | ICD-10-CM

## 2016-09-17 DIAGNOSIS — E785 Hyperlipidemia, unspecified: Secondary | ICD-10-CM | POA: Diagnosis not present

## 2016-09-17 DIAGNOSIS — Z79899 Other long term (current) drug therapy: Secondary | ICD-10-CM

## 2016-09-17 NOTE — Progress Notes (Signed)
Subjective:  Presents for recheck on HTN. Retired but working a few days a week. Stress much improved. Fairly active job requiring going up and down stairs. No other exercise. No added salt to diet. Rare edema. No CP/ischemic type pain or SOB.   Objective:   BP 128/82   Ht 5' 1.5" (1.562 m)   Wt 184 lb 9.6 oz (83.7 kg)   BMI 34.32 kg/m  NAD. Alert, oriented. Lungs clear. Heart RRR. Carotids no bruits or thrills. LE: no edema. Weight stable.   Assessment:   Problem List Items Addressed This Visit      Cardiovascular and Mediastinum   Hypertension - Primary   Relevant Orders   Hepatic function panel   Basic metabolic panel     Other   Hyperlipemia   Relevant Orders   Lipid panel    Other Visit Diagnoses    High risk medication use       Relevant Orders   CBC with Differential/Platelet   Other fatigue       Relevant Orders   VITAMIN D 25 Hydroxy (Vit-D Deficiency, Fractures)       Plan:  Continue current medications. Recommend weight loss and regular walking. Follow up in December for yearly physical. Sooner if any problems.

## 2016-09-20 ENCOUNTER — Encounter: Payer: Self-pay | Admitting: Nurse Practitioner

## 2016-10-01 ENCOUNTER — Other Ambulatory Visit: Payer: Self-pay | Admitting: Family Medicine

## 2016-10-30 LAB — CBC WITH DIFFERENTIAL/PLATELET
BASOS ABS: 0 10*3/uL (ref 0.0–0.2)
BASOS: 0 %
EOS (ABSOLUTE): 0.2 10*3/uL (ref 0.0–0.4)
EOS: 3 %
HEMATOCRIT: 39.5 % (ref 34.0–46.6)
Hemoglobin: 13.8 g/dL (ref 11.1–15.9)
Immature Grans (Abs): 0 10*3/uL (ref 0.0–0.1)
Immature Granulocytes: 0 %
LYMPHS: 34 %
Lymphocytes Absolute: 2.2 10*3/uL (ref 0.7–3.1)
MCH: 28.9 pg (ref 26.6–33.0)
MCHC: 34.9 g/dL (ref 31.5–35.7)
MCV: 83 fL (ref 79–97)
Monocytes Absolute: 0.5 10*3/uL (ref 0.1–0.9)
Monocytes: 9 %
NEUTROS ABS: 3.4 10*3/uL (ref 1.4–7.0)
Neutrophils: 54 %
PLATELETS: 295 10*3/uL (ref 150–379)
RBC: 4.77 x10E6/uL (ref 3.77–5.28)
RDW: 14.2 % (ref 12.3–15.4)
WBC: 6.3 10*3/uL (ref 3.4–10.8)

## 2016-10-30 LAB — VITAMIN D 25 HYDROXY (VIT D DEFICIENCY, FRACTURES): Vit D, 25-Hydroxy: 25.4 ng/mL — ABNORMAL LOW (ref 30.0–100.0)

## 2016-10-30 LAB — BASIC METABOLIC PANEL
BUN/Creatinine Ratio: 15 (ref 9–23)
BUN: 11 mg/dL (ref 6–24)
CO2: 28 mmol/L (ref 20–29)
Calcium: 9.4 mg/dL (ref 8.7–10.2)
Chloride: 97 mmol/L (ref 96–106)
Creatinine, Ser: 0.71 mg/dL (ref 0.57–1.00)
GFR calc Af Amer: 113 mL/min/{1.73_m2} (ref >59)
GFR, EST NON AFRICAN AMERICAN: 98 mL/min/{1.73_m2} (ref >59)
Glucose: 88 mg/dL (ref 65–99)
POTASSIUM: 3.6 mmol/L (ref 3.5–5.2)
SODIUM: 139 mmol/L (ref 134–144)

## 2016-10-30 LAB — LIPID PANEL
CHOL/HDL RATIO: 3.5 ratio (ref 0.0–4.4)
Cholesterol, Total: 181 mg/dL (ref 100–199)
HDL: 51 mg/dL (ref >39)
LDL Calculated: 107 mg/dL — ABNORMAL HIGH (ref 0–99)
TRIGLYCERIDES: 114 mg/dL (ref 0–149)
VLDL Cholesterol Cal: 23 mg/dL (ref 5–40)

## 2016-10-30 LAB — HEPATIC FUNCTION PANEL
ALT: 18 IU/L (ref 0–32)
AST: 16 IU/L (ref 0–40)
Albumin: 4.3 g/dL (ref 3.5–5.5)
Alkaline Phosphatase: 52 IU/L (ref 39–117)
BILIRUBIN TOTAL: 0.6 mg/dL (ref 0.0–1.2)
BILIRUBIN, DIRECT: 0.14 mg/dL (ref 0.00–0.40)
TOTAL PROTEIN: 6.4 g/dL (ref 6.0–8.5)

## 2016-11-01 ENCOUNTER — Encounter: Payer: Self-pay | Admitting: Nurse Practitioner

## 2016-11-01 ENCOUNTER — Other Ambulatory Visit: Payer: Self-pay | Admitting: Family Medicine

## 2016-11-02 NOTE — Telephone Encounter (Signed)
Last seen 09/17/16 

## 2016-12-15 ENCOUNTER — Ambulatory Visit: Payer: BC Managed Care – PPO

## 2016-12-15 ENCOUNTER — Other Ambulatory Visit: Payer: Self-pay | Admitting: Family Medicine

## 2016-12-17 ENCOUNTER — Ambulatory Visit (INDEPENDENT_AMBULATORY_CARE_PROVIDER_SITE_OTHER): Payer: BC Managed Care – PPO

## 2016-12-17 DIAGNOSIS — Z23 Encounter for immunization: Secondary | ICD-10-CM

## 2016-12-29 ENCOUNTER — Ambulatory Visit: Payer: BC Managed Care – PPO

## 2017-01-12 ENCOUNTER — Encounter: Payer: Self-pay | Admitting: Family Medicine

## 2017-01-12 ENCOUNTER — Ambulatory Visit (INDEPENDENT_AMBULATORY_CARE_PROVIDER_SITE_OTHER): Payer: BC Managed Care – PPO | Admitting: Family Medicine

## 2017-01-12 VITALS — BP 132/82 | Temp 99.1°F | Ht 61.0 in | Wt 183.0 lb

## 2017-01-12 DIAGNOSIS — T7840XA Allergy, unspecified, initial encounter: Secondary | ICD-10-CM | POA: Diagnosis not present

## 2017-01-12 MED ORDER — METHYLPREDNISOLONE ACETATE 40 MG/ML IJ SUSP
40.0000 mg | Freq: Once | INTRAMUSCULAR | Status: AC
  Administered 2017-01-12: 40 mg via INTRAMUSCULAR

## 2017-01-12 MED ORDER — PREDNISONE 20 MG PO TABS: ORAL_TABLET | ORAL | 0 refills | Status: DC

## 2017-01-12 NOTE — Progress Notes (Signed)
   Subjective:    Patient ID: Gabrielle Little, female    DOB: [birth year], 51 y.o.   MRN: [id number]  HPI  Patient is here today to follow up on a rash that has came upon her whole body. She noticed this past Monday that she was itching all over.Some raised bumps,but it she has red splotches all over.She can not pinpoint any thing new that she she has been using like soaps,lotions ect. Significant rash started earlier this week lower back spread up into her mid back upper back and around her sides some on the legs some on the arms denies any new foods soaps detergents recently came off of clindamycin she does not think she is taking this before she denies any other particular troubles denies any history of this no swelling in her throat no difficulty breathing Review of Systems Please see above    Objective:   Physical Exam Lungs clear no crackles heart regular throat is normal severe erythematous rash noted on her lower back upper back chest consistent with angioedema no compromise of throat or lungs      Assessment & Plan:  Angioedema Probable allergic reaction to clindamycin Depo-Medrol Prednisone taper Benadryl as needed Follow-up if progressive troubles or if worse

## 2017-01-26 ENCOUNTER — Other Ambulatory Visit: Payer: Self-pay | Admitting: Nurse Practitioner

## 2017-01-27 ENCOUNTER — Encounter: Payer: BC Managed Care – PPO | Admitting: Nurse Practitioner

## 2017-02-08 ENCOUNTER — Other Ambulatory Visit: Payer: Self-pay | Admitting: Nurse Practitioner

## 2017-02-28 ENCOUNTER — Encounter: Payer: BC Managed Care – PPO | Admitting: Nurse Practitioner

## 2017-03-18 ENCOUNTER — Encounter: Payer: BC Managed Care – PPO | Admitting: Nurse Practitioner

## 2017-04-25 ENCOUNTER — Encounter: Payer: Self-pay | Admitting: Family Medicine

## 2017-04-25 ENCOUNTER — Encounter: Payer: BC Managed Care – PPO | Admitting: Nurse Practitioner

## 2017-04-25 DIAGNOSIS — Z029 Encounter for administrative examinations, unspecified: Secondary | ICD-10-CM

## 2017-05-03 ENCOUNTER — Encounter: Payer: Self-pay | Admitting: Nurse Practitioner

## 2017-05-03 ENCOUNTER — Ambulatory Visit (INDEPENDENT_AMBULATORY_CARE_PROVIDER_SITE_OTHER): Payer: BC Managed Care – PPO | Admitting: Nurse Practitioner

## 2017-05-03 VITALS — BP 124/76 | Ht 61.0 in | Wt 186.8 lb

## 2017-05-03 DIAGNOSIS — Z124 Encounter for screening for malignant neoplasm of cervix: Secondary | ICD-10-CM | POA: Diagnosis not present

## 2017-05-03 DIAGNOSIS — Z79899 Other long term (current) drug therapy: Secondary | ICD-10-CM

## 2017-05-03 DIAGNOSIS — Z1231 Encounter for screening mammogram for malignant neoplasm of breast: Secondary | ICD-10-CM | POA: Diagnosis not present

## 2017-05-03 DIAGNOSIS — Z1211 Encounter for screening for malignant neoplasm of colon: Secondary | ICD-10-CM | POA: Diagnosis not present

## 2017-05-03 DIAGNOSIS — Z1151 Encounter for screening for human papillomavirus (HPV): Secondary | ICD-10-CM

## 2017-05-03 DIAGNOSIS — Z0000 Encounter for general adult medical examination without abnormal findings: Secondary | ICD-10-CM | POA: Diagnosis not present

## 2017-05-03 DIAGNOSIS — E785 Hyperlipidemia, unspecified: Secondary | ICD-10-CM

## 2017-05-03 MED ORDER — MEDROXYPROGESTERONE ACETATE 150 MG/ML IM SUSP: 150.0000 mg | Freq: Once | INTRAMUSCULAR | Status: DC

## 2017-05-03 NOTE — Progress Notes (Signed)
Subjective:    Patient ID: Gabrielle Little R Little, female    DOB: 18-Dec-1965, 52 y.o.   MRN: [id number]  HPI Patient presents today for wellness exam. Patient denies any currently complaints. Patient receives regular eye and dental exams. Patient retired in August and working two part time jobs. Patient is due for pap smear due to pap resulting positive for HPV at last pelvic exam. Colonoscopy is two years overdue, but patient agrees to referral.   Past Medical History:  Diagnosis Date  . Allergic rhinitis   . Anxiety   . Hyperlipidemia   . Hypertension    Past Surgical History:  Procedure Laterality Date  . CESAREAN SECTION     Family History  Problem Relation Age of Onset  . Hypertension Mother   . Osteoporosis Mother   . Heart attack Father [phone]       first MI  . Hypertension Father   . Diabetes Father   . Cancer Maternal Aunt        breast  . Hypertension Maternal Grandfather   . Diabetes Paternal Grandmother    Social History   Tobacco Use  . Smoking status: Never Smoker  . Smokeless tobacco: Never Used  Substance Use Topics  . Alcohol use: No  . Drug use: No   -Occupation: Part time Runner, broadcasting/film/videoTeacher and Part time Pharmacy Tech -Living Environment: Lives at home with husband and two of her children.  -Caffeine: Coffee and sodas occasionally -Diet: Cereal for breakfast, sandwich for lunch, cheese crackers for a snack, and meat, vegetable, and carb for dinner.  -Exercise: Walks frequently at work but would like to increase outdoor walking when the weather permits.   Sexual Health Patient husband is only sexual partner. Patient reports menstrual cycles occurring every 1.5 months. Pt experiences light bleeding lasting 3-4 days and denies cramping.   Allergies  Allergen Reactions  . Biaxin [Clarithromycin] Rash  . Clindamycin/Lincomycin Rash    Patient had angioedema extensive rash 2 days after stopping clindamycin-probable allergy    Review of Systems General: Pt reports  adequate energy level with a regular sleeping patterns. Pt denies weight loss or weight gain.  HEENT: Denies vision changes, hearing changes, or nasal congestion. Pt denies sore throat or difficulty swallowing Cardiac: Denies chest pain or ischemic type pain. Denies palpitations, pressure, syncope or dizziness Pulmonary: Denies shortness of breath, dyspnea, or cough.  GI: Pt denies abdominal tenderness or N/V/D. Patient reports regular daily bowel movements.  GU: Denies frequency, urgency, or hesitancy. Denies change in color or odor of urine. Pt denies flank pain.  GYN: Pt denies itching or vaginal discomfort. Breast: Pt denies tenderness, pain, or swelling. Pt denies nipple discharge, welling, or tenderness.       Objective:   Physical Exam  Vitals:   05/03/17 0906  BP: 124/76   General: WDWN. Appears calm and well-groomed. Dressed  Appropriate for weather.  HEENT: Ears - Canals no erythema, exudate, or lesions. TM: no erythema with mild retraction. Throat - no lesions, erythema, or exudate. Oropharnyx with cloudy post nasal drainage. Thyroid - Midline, non-tender with no visible enlargement, nodules, or masses on palpation. Lymph: Cervical lymph not enlarged, non-tender. No adenopathy.  Pulmonary: Chest expansion symmetrical. No use of accessory muscles. Lungs without adventitious breath sounds.  Cardiac: RRR. S1 S2 intact. No murmurs or gallops. No peripheral edema. No carotid bruits.  Abdomen: soft, non-tender. No masses, tenderness, or organomegaly on palpation.   Breast: Breasts symmetrical and smooth without nodules or masses. No  erythema, swelling, or skin changes. Nipples symmetrical without discharge. Axillae no adenopathy.  GYN/GU: External - Genitalia without erythema, lesions, swelling or masses. Urethra without erythema and midline. Clitoris without enlargement, lesions, or erythema. Vaginal mucosa pink without blood or discharge. Cervix pink and without discharge. No cervical  motion tenderness to bimanual exam. Uterus and ovaries without masses or tenderness on bimanual exam. No lesions or hemorrhoids on external rectal exam.      Assessment & Plan:   Problem List Items Addressed This Visit      Other   Hyperlipemia   Relevant Orders   Lipid panel    Other Visit Diagnoses    Routine general medical examination at a health care facility    -  Primary   Relevant Orders   Lipid panel   Hepatic function panel   Pap IG and HPV (high risk) DNA detection   High risk medication use       Relevant Medications      Other Relevant Orders   Hepatic function panel   Screening for cervical cancer       Relevant Orders   Pap IG and HPV (high risk) DNA detection   Screening for HPV (human papillomavirus)       Relevant Orders   Pap IG and HPV (high risk) DNA detection   Encounter for screening mammogram for breast cancer       Relevant Orders   MM DIGITAL SCREENING BILATERAL   Screening for colon cancer       Relevant Orders   Ambulatory referral to Gastroenterology      Plan:  -Testing: Pap smear and mammogram. Labs pending.  -Medication: Continue current medication regimen -Referrals: For colonoscopy   Encouraged regular activity and weight loss. Recommend daily vitamin D and calcium.  -Follow Up: Return in 6 months for chronic recheck.

## 2017-05-04 ENCOUNTER — Encounter: Payer: Self-pay | Admitting: Nurse Practitioner

## 2017-05-05 ENCOUNTER — Encounter: Payer: Self-pay | Admitting: Family Medicine

## 2017-05-05 LAB — PAP IG AND HPV HIGH-RISK
HPV, high-risk: NEGATIVE
PAP SMEAR COMMENT: 0

## 2017-05-11 ENCOUNTER — Encounter (INDEPENDENT_AMBULATORY_CARE_PROVIDER_SITE_OTHER): Payer: Self-pay | Admitting: *Deleted

## 2017-05-11 ENCOUNTER — Ambulatory Visit (HOSPITAL_COMMUNITY): Payer: BC Managed Care – PPO

## 2017-05-17 ENCOUNTER — Other Ambulatory Visit: Payer: Self-pay | Admitting: Family Medicine

## 2017-05-17 ENCOUNTER — Other Ambulatory Visit: Payer: Self-pay | Admitting: Nurse Practitioner

## 2017-05-18 ENCOUNTER — Ambulatory Visit (HOSPITAL_COMMUNITY)
Admission: RE | Admit: 2017-05-18 | Discharge: 2017-05-18 | Disposition: A | Discharge status: Home / Self Care | Payer: BC Managed Care – PPO | Source: Ambulatory Visit | Attending: Nurse Practitioner | Admitting: Nurse Practitioner

## 2017-05-18 DIAGNOSIS — Z1231 Encounter for screening mammogram for malignant neoplasm of breast: Secondary | ICD-10-CM | POA: Diagnosis present

## 2017-05-19 ENCOUNTER — Other Ambulatory Visit: Payer: Self-pay | Admitting: *Deleted

## 2017-05-19 LAB — LIPID PANEL
CHOL/HDL RATIO: 3.3 ratio (ref 0.0–4.4)
Cholesterol, Total: 181 mg/dL (ref 100–199)
HDL: 55 mg/dL (ref >39)
LDL CALC: 102 mg/dL — AB (ref 0–99)
Triglycerides: 121 mg/dL (ref 0–149)
VLDL Cholesterol Cal: 24 mg/dL (ref 5–40)

## 2017-05-19 LAB — HEPATIC FUNCTION PANEL
ALBUMIN: 4.3 g/dL (ref 3.5–5.5)
ALT: 24 IU/L (ref 0–32)
AST: 17 IU/L (ref 0–40)
Alkaline Phosphatase: 59 IU/L (ref 39–117)
Bilirubin Total: 0.5 mg/dL (ref 0.0–1.2)
Bilirubin, Direct: 0.14 mg/dL (ref 0.00–0.40)
TOTAL PROTEIN: 6.5 g/dL (ref 6.0–8.5)

## 2017-05-19 MED ORDER — PRAVASTATIN SODIUM 20 MG PO TABS: ORAL_TABLET | ORAL | 1 refills | Status: DC

## 2017-07-02 ENCOUNTER — Other Ambulatory Visit: Payer: Self-pay | Admitting: Family Medicine

## 2017-08-17 ENCOUNTER — Other Ambulatory Visit: Payer: Self-pay | Admitting: Family Medicine

## 2017-08-17 ENCOUNTER — Other Ambulatory Visit: Payer: Self-pay | Admitting: Nurse Practitioner

## 2017-08-30 ENCOUNTER — Telehealth: Payer: Self-pay | Admitting: Family Medicine

## 2017-08-30 ENCOUNTER — Other Ambulatory Visit: Payer: Self-pay | Admitting: *Deleted

## 2017-08-30 MED ORDER — HYDROCHLOROTHIAZIDE 25 MG PO TABS: 25.0000 mg | ORAL_TABLET | Freq: Every day | ORAL | 0 refills | Status: DC

## 2017-08-30 NOTE — Telephone Encounter (Signed)
90 day supply ok per dr Brett Canalessteve. Refill sent. Pt notified.

## 2017-08-30 NOTE — Telephone Encounter (Signed)
Pt is wanting to know if Dr. Brett CanalesSteve will go ahead and refill her hydrochlorothiazide (HYDRODIURIL) 25 MG tablet until she does her 6 month follow up in October. She had her CPE with Eber Jonesarolyn in April. [facility]

## 2017-10-12 ENCOUNTER — Ambulatory Visit: Payer: BC Managed Care – PPO | Admitting: Family Medicine

## 2017-10-12 ENCOUNTER — Encounter: Payer: Self-pay | Admitting: Family Medicine

## 2017-10-12 VITALS — BP 130/76 | Ht 61.0 in | Wt 179.8 lb

## 2017-10-12 DIAGNOSIS — E785 Hyperlipidemia, unspecified: Secondary | ICD-10-CM

## 2017-10-12 DIAGNOSIS — E559 Vitamin D deficiency, unspecified: Secondary | ICD-10-CM | POA: Diagnosis not present

## 2017-10-12 DIAGNOSIS — I10 Essential (primary) hypertension: Secondary | ICD-10-CM

## 2017-10-12 DIAGNOSIS — Z23 Encounter for immunization: Secondary | ICD-10-CM

## 2017-10-12 DIAGNOSIS — Z79899 Other long term (current) drug therapy: Secondary | ICD-10-CM

## 2017-10-12 DIAGNOSIS — F419 Anxiety disorder, unspecified: Secondary | ICD-10-CM

## 2017-10-12 MED ORDER — PRAVASTATIN SODIUM 20 MG PO TABS: ORAL_TABLET | ORAL | 1 refills | Status: DC

## 2017-10-12 MED ORDER — FLUOXETINE HCL 40 MG PO CAPS: 40.0000 mg | ORAL_CAPSULE | Freq: Every day | ORAL | 1 refills | Status: DC

## 2017-10-12 MED ORDER — AMLODIPINE BESYLATE 10 MG PO TABS: ORAL_TABLET | ORAL | 1 refills | Status: DC

## 2017-10-12 MED ORDER — HYDROCHLOROTHIAZIDE 25 MG PO TABS: 25.0000 mg | ORAL_TABLET | Freq: Every day | ORAL | 1 refills | Status: DC

## 2017-10-12 NOTE — Progress Notes (Signed)
   Subjective:    Patient ID: Gabrielle Little, female    DOB: August 31, 1965, 52 y.o.   MRN: [id number]  Hypertension  This is a chronic problem. The current episode started more than 1 year ago. Risk factors for coronary artery disease include post-menopausal state. Treatments tried: norvasc, hctz.   Blood pressure medicine and blood pressure levels reviewed today with patient. Compliant with blood pressure medicine. States does not miss a dose. No obvious side effects. Blood pressure generally good when checked elsewhere. Watching salt intake.  Walking three to four days per wk, since April, was hoping to lose more weight   Now on supplement of vit d since last yrs lowish numbers  s3witched to qhs for the pravachol  .Patient continues to take lipid medication regularly. No obvious side effects from it. Generally does not miss a dose. Prior blood work results are reviewed with patient. Patient continues to work on fat intake in diet  Patient notes ongoing compliance with antidepressant medication. No obvious side effects. Reports does not miss a dose. Overall continues to help generalized anxiety and element of depression substantially. No thoughts of homicide or suicide. Would like to maintain medication.    Review of Systems No headache, no major weight loss or weight gain, no chest pain no back pain abdominal pain no change in bowel habits complete ROS otherwise negative     Objective:   Physical Exam  Alert and oriented, vitals reviewed and stable, NAD ENT-TM's and ext canals WNL bilat via otoscopic exam Soft palate, tonsils and post pharynx WNL via oropharyngeal exam Neck-symmetric, no masses; thyroid nonpalpable and nontender Pulmonary-no tachypnea or accessory muscle use; Clear without wheezes via auscultation Card--no abnrml murmurs, rhythm reg and rate WNL Carotid pulses symmetric, without bruits       Assessment & Plan:  Impression 1 hyperlipidemia.  Discussed.  Blood  work discussed.  Maintain medications compliance discussed diet discussed  2.  Hypertension.  Good control.  Patient maintains same salt intake discussed exercise encouraged  3.  Generalized anxiety disorder with element of depression.  Discussed.  Patient maintain Prozac.  Now working part-time.  She feels the medication will definitely still help and I agree.  Her graph follow-up in 6 months.  Flu shot today./Appropriate blood work

## 2017-10-22 LAB — LIPID PANEL
CHOL/HDL RATIO: 3.5 ratio (ref 0.0–4.4)
Cholesterol, Total: 170 mg/dL (ref 100–199)
HDL: 49 mg/dL (ref >39)
LDL Calculated: 93 mg/dL (ref 0–99)
Triglycerides: 138 mg/dL (ref 0–149)
VLDL Cholesterol Cal: 28 mg/dL (ref 5–40)

## 2017-10-22 LAB — HEPATIC FUNCTION PANEL
ALT: 21 IU/L (ref 0–32)
AST: 17 IU/L (ref 0–40)
Albumin: 4.3 g/dL (ref 3.5–5.5)
Alkaline Phosphatase: 57 IU/L (ref 39–117)
BILIRUBIN, DIRECT: 0.14 mg/dL (ref 0.00–0.40)
Bilirubin Total: 0.5 mg/dL (ref 0.0–1.2)
TOTAL PROTEIN: 6.2 g/dL (ref 6.0–8.5)

## 2017-10-22 LAB — BASIC METABOLIC PANEL
BUN / CREAT RATIO: 14 (ref 9–23)
BUN: 10 mg/dL (ref 6–24)
CALCIUM: 9.1 mg/dL (ref 8.7–10.2)
CHLORIDE: 99 mmol/L (ref 96–106)
CO2: 26 mmol/L (ref 20–29)
Creatinine, Ser: 0.7 mg/dL (ref 0.57–1.00)
GFR calc Af Amer: 114 mL/min/{1.73_m2} (ref >59)
GFR, EST NON AFRICAN AMERICAN: 99 mL/min/{1.73_m2} (ref >59)
Glucose: 91 mg/dL (ref 65–99)
POTASSIUM: 3.6 mmol/L (ref 3.5–5.2)
Sodium: 140 mmol/L (ref 134–144)

## 2017-10-23 ENCOUNTER — Encounter: Payer: Self-pay | Admitting: Family Medicine

## 2017-11-01 DIAGNOSIS — Z029 Encounter for administrative examinations, unspecified: Secondary | ICD-10-CM

## 2018-05-24 ENCOUNTER — Other Ambulatory Visit: Payer: Self-pay | Admitting: Family Medicine

## 2018-06-19 ENCOUNTER — Other Ambulatory Visit: Payer: Self-pay | Admitting: Family Medicine

## 2018-06-20 ENCOUNTER — Other Ambulatory Visit: Payer: Self-pay

## 2018-06-20 MED ORDER — NORETHINDRONE 0.35 MG PO TABS: ORAL_TABLET | ORAL | 1 refills | Status: DC

## 2018-07-06 ENCOUNTER — Other Ambulatory Visit (HOSPITAL_COMMUNITY): Payer: Self-pay | Admitting: Family Medicine

## 2018-07-06 DIAGNOSIS — Z1231 Encounter for screening mammogram for malignant neoplasm of breast: Secondary | ICD-10-CM

## 2018-07-14 ENCOUNTER — Other Ambulatory Visit: Payer: Self-pay | Admitting: Family Medicine

## 2018-11-27 ENCOUNTER — Other Ambulatory Visit: Payer: Self-pay | Admitting: Family Medicine

## 2018-11-27 ENCOUNTER — Telehealth: Payer: Self-pay | Admitting: Family Medicine

## 2018-11-27 MED ORDER — AMLODIPINE BESYLATE 10 MG PO TABS: ORAL_TABLET | ORAL | 0 refills | Status: DC

## 2018-11-27 NOTE — Telephone Encounter (Signed)
Patient is requesting refill on blood pressure medication she is completely out and she schedule appointment for 10/30. [place]

## 2018-11-27 NOTE — Telephone Encounter (Signed)
Ok one rx

## 2018-11-27 NOTE — Telephone Encounter (Signed)
Prescription sent electronically to pharmacy. Patient notified. 

## 2018-12-01 ENCOUNTER — Ambulatory Visit (INDEPENDENT_AMBULATORY_CARE_PROVIDER_SITE_OTHER): Payer: BC Managed Care – PPO | Admitting: Family Medicine

## 2018-12-01 DIAGNOSIS — E785 Hyperlipidemia, unspecified: Secondary | ICD-10-CM

## 2018-12-01 DIAGNOSIS — I10 Essential (primary) hypertension: Secondary | ICD-10-CM

## 2018-12-01 DIAGNOSIS — F419 Anxiety disorder, unspecified: Secondary | ICD-10-CM

## 2018-12-01 DIAGNOSIS — Z79899 Other long term (current) drug therapy: Secondary | ICD-10-CM | POA: Diagnosis not present

## 2018-12-01 MED ORDER — HYDROCHLOROTHIAZIDE 25 MG PO TABS: ORAL_TABLET | ORAL | 1 refills | Status: DC

## 2018-12-01 MED ORDER — FLUOXETINE HCL 40 MG PO CAPS: ORAL_CAPSULE | ORAL | 1 refills | Status: DC

## 2018-12-01 MED ORDER — NORETHINDRONE 0.35 MG PO TABS: ORAL_TABLET | ORAL | 1 refills | Status: DC

## 2018-12-01 MED ORDER — PRAVASTATIN SODIUM 20 MG PO TABS: ORAL_TABLET | ORAL | 1 refills | Status: DC

## 2018-12-01 MED ORDER — AMLODIPINE BESYLATE 10 MG PO TABS: ORAL_TABLET | ORAL | 1 refills | Status: DC

## 2018-12-01 NOTE — Progress Notes (Signed)
   Subjective:    Patient ID: Gabrielle Little, female    DOB: 1965/03/29, 53 y.o.   MRN: [id number]  HPI Pt here today for follow up. Pt states she is doing well. Does not take BP as often as she should. Pt states the Norethindrone is working great. Taking Amlodipine 10 mg and HCTZ 25 mg daily with no issues.   Virtual Visit via Video Note  I connected with Gabrielle Little on 12/01/18 at  8:30 AM EDT by a video enabled telemedicine application and verified that I am speaking with the correct person using two identifiers.  Location: Patient: home Provider: office   I discussed the limitations of evaluation and management by telemedicine and the availability of in person appointments. The patient expressed understanding and agreed to proceed.  History of Present Illness:    Observations/Objective:   Assessment and Plan:   Follow Up Instructions:    I discussed the assessment and treatment plan with the patient. The patient was provided an opportunity to ask questions and all were answered. The patient agreed with the plan and demonstrated an understanding of the instructions.   The patient was advised to call back or seek an in-person evaluation if the symptoms worsen or if the condition fails to improve as anticipated.  I provided 25 minutes of non-face-to-face time during this encounter.   Vicente Males, LPN  Blood pressure medicine and blood pressure levels reviewed today with patient. Compliant with blood pressure medicine. States does not miss a dose. No obvious side effects. Blood pressure generally good when checked elsewhere. Watching salt intake.  130 syst  Low 80s  Exercise not the best  Diet overall pretty good   Patient continues to take lipid medication regularly. No obvious side effects from it. Generally does not miss a dose. Prior blood work results are reviewed with patient. Patient continues to work on fat intake in diet  Patient notes ongoing compliance  with antidepressant medication.  plan primarily used for anxietyNo obvious side effects. Reports does not miss a dose. Overall continues to help depression substantially. No thoughts of homicide or suicide. Would like to maintain medication.  Review of Systems No headache, no major weight loss or weight gain, no chest pain no back pain abdominal pain no change in bowel habits complete ROS otherwise negative     Objective:   Physical Exam   Virtual     Assessment & Plan:  Impression 1 hypertension.  Currently controlled discussed compliance discussed diet exercise discussed  2.  Hyperlipidemia.  Exact status uncertain.  Appropriate blood work encouraged.  Compliance discussed diet discussed  3.  AnxietyDepression clinically stable patient maintain same meds diet exercise  Follow-up in 6 months further recommendations based on blood work

## 2018-12-03 ENCOUNTER — Encounter: Payer: Self-pay | Admitting: Family Medicine

## 2019-01-03 ENCOUNTER — Other Ambulatory Visit: Payer: Self-pay

## 2019-01-03 DIAGNOSIS — Z20822 Contact with and (suspected) exposure to covid-19: Secondary | ICD-10-CM

## 2019-01-05 ENCOUNTER — Telehealth: Payer: Self-pay | Admitting: *Deleted

## 2019-01-05 NOTE — Telephone Encounter (Signed)
Pt and her husband called to receive the result of her COVID test from 01/03/2019; explained that test is not resulted yet, and the turn around time is based on the number of tests to be completed; also explained that she will receive a call regarding results; pt encouraged to answer all calls; she verbalized understanding.

## 2019-01-06 LAB — NOVEL CORONAVIRUS, NAA: SARS-CoV-2, NAA: DETECTED — AB

## 2019-01-08 ENCOUNTER — Ambulatory Visit (INDEPENDENT_AMBULATORY_CARE_PROVIDER_SITE_OTHER): Payer: BC Managed Care – PPO | Admitting: Family Medicine

## 2019-01-08 ENCOUNTER — Encounter: Payer: Self-pay | Admitting: Family Medicine

## 2019-01-08 DIAGNOSIS — U071 COVID-19: Secondary | ICD-10-CM | POA: Diagnosis not present

## 2019-01-08 NOTE — Progress Notes (Signed)
   Subjective:    Patient ID: Gabrielle Little, female    DOB: [birth year], 53 y.o.   MRN: [id number]  HPI Pt found out she was positive on Saturday for COVID. Pt has had cough and stuffiness for about 2 weeks. Pt husband tested negative and is having no symptoms. Pt has been drinking lots of hot coffee and hot chocolate.   Virtual Visit via Video Note  I connected with Gabrielle Little on 01/08/19 at 11:00 AM EST by a video enabled telemedicine application and verified that I am speaking with the correct person using two identifiers.  Location: Patient: home Provider: office   I discussed the limitations of evaluation and management by telemedicine and the availability of in person appointments. The patient expressed understanding and agreed to proceed.  History of Present Illness:    Observations/Objective:   Assessment and Plan:   Follow Up Instructions:    I discussed the assessment and treatment plan with the patient. The patient was provided an opportunity to ask questions and all were answered. The patient agreed with the plan and demonstrated an understanding of the instructions.   The patient was advised to call back or seek an in-person evaluation if the symptoms worsen or if the condition fails to improve as anticipated.  I provided 17 minutes of non-face-to-face time during this encounter.  Patient was exposed to his son on Thanksgiving who ended up getting sick with COVID-19.  Patient has had no acute symptoms.  She had mild low-grade congestion for weeks.  Unrelated.  Patient's spouse and daughter tested  No headache no cough no congestion no GI symptoms no fever overall feels good Gabrielle Males, LPN  Review of Systems See above    Objective:   Physical Exam  Virtual      Assessment & Plan:  Impression COVID-19 positive asymptomatic.  Multiple questions answered.  Gabrielle Little of quarantine discussed

## 2019-01-23 ENCOUNTER — Other Ambulatory Visit (INDEPENDENT_AMBULATORY_CARE_PROVIDER_SITE_OTHER): Payer: BC Managed Care – PPO

## 2019-01-23 ENCOUNTER — Other Ambulatory Visit: Payer: Self-pay

## 2019-01-23 DIAGNOSIS — Z23 Encounter for immunization: Secondary | ICD-10-CM

## 2019-03-07 ENCOUNTER — Encounter: Payer: Self-pay | Admitting: Family Medicine

## 2019-03-21 ENCOUNTER — Telehealth: Payer: Self-pay | Admitting: Family Medicine

## 2019-03-21 DIAGNOSIS — I10 Essential (primary) hypertension: Secondary | ICD-10-CM

## 2019-03-21 DIAGNOSIS — E785 Hyperlipidemia, unspecified: Secondary | ICD-10-CM

## 2019-03-21 DIAGNOSIS — Z79899 Other long term (current) drug therapy: Secondary | ICD-10-CM

## 2019-03-21 NOTE — Telephone Encounter (Signed)
BMET, Hep and lipid re-ordered to go to [facility]. Left message to return call

## 2019-03-21 NOTE — Telephone Encounter (Signed)
Pt would like her lab orders sent to [facility]

## 2019-03-26 NOTE — Telephone Encounter (Signed)
Pt notified and orders put up front for pt to pickup and take to [facility]

## 2019-05-10 LAB — HEPATIC FUNCTION PANEL
AG Ratio: 1.8 (calc) (ref 1.0–2.5)
ALT: 15 U/L (ref 6–29)
AST: 12 U/L (ref 10–35)
Albumin: 4.1 g/dL (ref 3.6–5.1)
Alkaline phosphatase (APISO): 51 U/L (ref 37–153)
Bilirubin, Direct: 0.2 mg/dL (ref 0.0–0.2)
Globulin: 2.3 g/dL (calc) (ref 1.9–3.7)
Indirect Bilirubin: 0.6 mg/dL (calc) (ref 0.2–1.2)
Total Bilirubin: 0.8 mg/dL (ref 0.2–1.2)
Total Protein: 6.4 g/dL (ref 6.1–8.1)

## 2019-05-10 LAB — LIPID PANEL
Cholesterol: 165 mg/dL (ref <200)
HDL: 57 mg/dL (ref >=50)
LDL Cholesterol (Calc): 84 mg/dL (calc)
Non-HDL Cholesterol (Calc): 108 mg/dL (calc) (ref <130)
Total CHOL/HDL Ratio: 2.9 (calc) (ref <5.0)
Triglycerides: 138 mg/dL (ref <150)

## 2019-05-10 LAB — BASIC METABOLIC PANEL
BUN: 10 mg/dL (ref 7–25)
CO2: 32 mmol/L (ref 20–32)
Calcium: 9.3 mg/dL (ref 8.6–10.4)
Chloride: 100 mmol/L (ref 98–110)
Creat: 0.65 mg/dL (ref 0.50–1.05)
Glucose, Bld: 96 mg/dL (ref 65–99)
Potassium: 3.5 mmol/L (ref 3.5–5.3)
Sodium: 139 mmol/L (ref 135–146)

## 2019-05-23 ENCOUNTER — Other Ambulatory Visit: Payer: Self-pay

## 2019-05-23 ENCOUNTER — Ambulatory Visit: Payer: BC Managed Care – PPO | Admitting: Family Medicine

## 2019-05-23 VITALS — BP 138/80 | Temp 98.3°F | Ht 61.0 in | Wt 190.6 lb

## 2019-05-23 DIAGNOSIS — E785 Hyperlipidemia, unspecified: Secondary | ICD-10-CM | POA: Diagnosis not present

## 2019-05-23 DIAGNOSIS — I10 Essential (primary) hypertension: Secondary | ICD-10-CM | POA: Diagnosis not present

## 2019-05-23 MED ORDER — HYDROCHLOROTHIAZIDE 25 MG PO TABS: ORAL_TABLET | ORAL | 1 refills | Status: DC

## 2019-05-23 MED ORDER — FLUOXETINE HCL 40 MG PO CAPS: ORAL_CAPSULE | ORAL | 1 refills | Status: DC

## 2019-05-23 MED ORDER — PRAVASTATIN SODIUM 20 MG PO TABS: ORAL_TABLET | ORAL | 1 refills | Status: DC

## 2019-05-23 MED ORDER — AMLODIPINE BESYLATE 10 MG PO TABS: ORAL_TABLET | ORAL | 1 refills | Status: DC

## 2019-05-23 MED ORDER — NORETHINDRONE 0.35 MG PO TABS: ORAL_TABLET | ORAL | 1 refills | Status: DC

## 2019-05-23 NOTE — Progress Notes (Signed)
   Subjective:  Patient presents ostensibly just for a rash but has chronic issues that need addressing  Patient ID: Gabrielle Little, female    DOB: [birth year], 53 y.o.   MRN: [id number]  HPIRash on back. Came up on Monday. Noticed some pain when water hit that spot in the shower and uncomfortable when leaning back in a chair.   Noted some discomfort this weekend Monday saw a blistery painful  Rxn  Felt blister oozing   Painful yesterday   Possible another  Blister present also   Blood pressure medicine and blood pressure levels reviewed today with patient. Compliant with blood pressure medicine. States does not miss a dose. No obvious side effects. Blood pressure generally good when checked elsewhere. Watching salt intake.   Patient continues to take lipid medication regularly. No obvious side effects from it. Generally does not miss a dose. Prior blood work results are reviewed with patient. Patient continues to work on fat intake in diet   No headache no chest pain no shortness of breath Review of Systems     Objective:   Physical Exam   Alert vitals stable, NAD. Blood pressure good on repeat. HEENT normal. Lungs clear. Heart regular rate and rhythm. 1 simple blister on the back does not appear like shingles at all more like a bite with excoriation     Assessment & Plan:  Impression 1 hypertension very good control discussed maintain same meds  2.  Hyperlipidemia controlled good.  Recent blood work discussed  3.  Tiny bite local measures discussed  Follow-up in 6 months diet exercise discussed

## 2019-05-23 NOTE — Progress Notes (Signed)
   Subjective:    Patient ID: Gabrielle Little, female    DOB: 1965-04-06, 54 y.o.   MRN: [id number]  HPI    Review of Systems     Objective:   Physical Exam        Assessment & Plan:

## 2019-06-06 ENCOUNTER — Ambulatory Visit: Payer: BC Managed Care – PPO | Admitting: Family Medicine

## 2019-11-05 ENCOUNTER — Other Ambulatory Visit (INDEPENDENT_AMBULATORY_CARE_PROVIDER_SITE_OTHER): Payer: BC Managed Care – PPO | Admitting: *Deleted

## 2019-11-05 DIAGNOSIS — Z23 Encounter for immunization: Secondary | ICD-10-CM | POA: Diagnosis not present

## 2019-11-13 ENCOUNTER — Telehealth: Payer: Self-pay

## 2019-11-13 DIAGNOSIS — E785 Hyperlipidemia, unspecified: Secondary | ICD-10-CM

## 2019-11-13 DIAGNOSIS — I10 Essential (primary) hypertension: Secondary | ICD-10-CM

## 2019-11-13 DIAGNOSIS — Z79899 Other long term (current) drug therapy: Secondary | ICD-10-CM

## 2019-11-13 NOTE — Telephone Encounter (Signed)
Last labs 05/10/19: Lipid, liver and Met 7

## 2019-11-13 NOTE — Telephone Encounter (Signed)
Patient has an appt with Clydie Braun on Monday for a physical and wants to have labwork done prior.  Patient uses [facility] so will need orders printed out and put up front for p/u.

## 2019-11-15 NOTE — Telephone Encounter (Signed)
Please order CMP, CBC, Lipid to be drawn before her visit in 2 weeks. Thanks, Clydie Braun

## 2019-11-15 NOTE — Addendum Note (Signed)
Addended by: Metro Kung on: 11/15/2019 10:20 AM   Modules accepted: Orders

## 2019-11-15 NOTE — Telephone Encounter (Signed)
Tried to call no answer on home number and mobile number on message was not correct number. Bw orders put in for [facility] and left at the front window for pick up

## 2019-11-19 ENCOUNTER — Encounter: Payer: BC Managed Care – PPO | Admitting: Family Medicine

## 2019-11-27 ENCOUNTER — Other Ambulatory Visit (HOSPITAL_COMMUNITY): Payer: Self-pay | Admitting: Family Medicine

## 2019-11-27 ENCOUNTER — Encounter: Payer: BC Managed Care – PPO | Admitting: Family Medicine

## 2019-11-27 DIAGNOSIS — Z1231 Encounter for screening mammogram for malignant neoplasm of breast: Secondary | ICD-10-CM

## 2019-12-11 ENCOUNTER — Ambulatory Visit (INDEPENDENT_AMBULATORY_CARE_PROVIDER_SITE_OTHER): Payer: BC Managed Care – PPO | Admitting: Family Medicine

## 2019-12-11 ENCOUNTER — Encounter: Payer: Self-pay | Admitting: Family Medicine

## 2019-12-11 ENCOUNTER — Other Ambulatory Visit: Payer: Self-pay

## 2019-12-11 VITALS — BP 132/82 | HR 95 | Temp 98.1°F | Wt 188.6 lb

## 2019-12-11 DIAGNOSIS — Z1211 Encounter for screening for malignant neoplasm of colon: Secondary | ICD-10-CM

## 2019-12-11 DIAGNOSIS — Z124 Encounter for screening for malignant neoplasm of cervix: Secondary | ICD-10-CM

## 2019-12-11 DIAGNOSIS — Z0000 Encounter for general adult medical examination without abnormal findings: Secondary | ICD-10-CM

## 2019-12-11 MED ORDER — FLUOXETINE HCL 40 MG PO CAPS: ORAL_CAPSULE | ORAL | 1 refills | Status: DC

## 2019-12-11 MED ORDER — AMLODIPINE BESYLATE 10 MG PO TABS: ORAL_TABLET | ORAL | 1 refills | Status: DC

## 2019-12-11 MED ORDER — NORETHINDRONE 0.35 MG PO TABS: ORAL_TABLET | ORAL | 2 refills | Status: DC

## 2019-12-11 MED ORDER — PRAVASTATIN SODIUM 20 MG PO TABS: ORAL_TABLET | ORAL | 1 refills | Status: DC

## 2019-12-11 MED ORDER — HYDROCHLOROTHIAZIDE 25 MG PO TABS: ORAL_TABLET | ORAL | 1 refills | Status: DC

## 2019-12-11 NOTE — Progress Notes (Signed)
Patient ID: NDIA Little, female    DOB: 1965/03/29, 54 y.o.   MRN: [id number]   Chief Complaint  Patient presents with  . Annual Exam   Subjective:  CC: annual physical  Presents today for her wellness exam. Her last Pap smear was done April 2019, with a negative HPV. 3 years ago, her Pap she had a positive HPV. She is scheduled for her mammogram this Thursday. She has never had colonoscopy. Her only concerns are related to the hormone therapy and possible menopause, and heavier than normal periods.   The patient comes in today for a wellness visit.    A review of their health history was completed.  A review of medications was also completed.  Eating habits: not great   Falls/  MVA accidents in past few months: none  Regular exercise: walking  Specialist pt sees on regular basis: none  Preventative health issues were discussed.   Additional concerns: Menopause symptoms  Medical History Gabrielle has a past medical history of Allergic rhinitis, Anxiety, Hyperlipidemia, and Hypertension.   Outpatient Encounter Medications as of 12/11/2019  Medication Sig  . amLODipine (NORVASC) 10 MG tablet TAKE ONE (1) TABLET BY MOUTH EVERY DAY  . FLUoxetine (PROZAC) 40 MG capsule TAKE ONE CAPSULE (40MG  TOTAL) BY MOUTH DAILY  . hydrochlorothiazide (HYDRODIURIL) 25 MG tablet TAKE ONE TABLET (25MG  TOTAL) BY MOUTH DAILY  . norethindrone (MICRONOR) 0.35 MG tablet TAKE ONE (1) TABLET BY MOUTH EVERY DAY  . pravastatin (PRAVACHOL) 20 MG tablet TAKE ONE BY MOUTH AFTER EVENING MEAL  . [DISCONTINUED] amLODipine (NORVASC) 10 MG tablet TAKE ONE (1) TABLET BY MOUTH EVERY DAY  . [DISCONTINUED] FLUoxetine (PROZAC) 40 MG capsule TAKE ONE CAPSULE (40MG  TOTAL) BY MOUTH DAILY  . [DISCONTINUED] hydrochlorothiazide (HYDRODIURIL) 25 MG tablet TAKE ONE TABLET (25MG  TOTAL) BY MOUTH DAILY  . [DISCONTINUED] norethindrone (MICRONOR) 0.35 MG tablet TAKE ONE (1) TABLET BY MOUTH EVERY DAY  . [DISCONTINUED] pravastatin  (PRAVACHOL) 20 MG tablet TAKE ONE BY MOUTH AFTER EVENING MEAL   No facility-administered encounter medications on file as of 12/11/2019.     Review of Systems  Constitutional: Negative for chills and fever.  Respiratory: Negative for shortness of breath.   Cardiovascular: Negative for chest pain.  Gastrointestinal: Negative for abdominal pain, constipation and diarrhea.  Genitourinary: Positive for menstrual problem. Negative for dysuria.       On hormone therapy, bled more than normal 3 weeks ago.     Vitals BP 132/82   Pulse 95   Temp 98.1 F (36.7 C)   Wt 188 lb 9.6 oz (85.5 kg)   SpO2 99%   BMI 35.64 kg/m   Objective:   Physical Exam Vitals and nursing note reviewed. Exam conducted with a chaperone present.  Constitutional:      Appearance: Normal appearance.  HENT:     Right Ear: Tympanic membrane normal.     Left Ear: Tympanic membrane normal.     Nose: Nose normal.     Mouth/Throat:     Mouth: Mucous membranes are moist.     Pharynx: Oropharynx is clear.  Cardiovascular:     Rate and Rhythm: Normal rate and regular rhythm.     Heart sounds: Normal heart sounds.  Pulmonary:     Effort: Pulmonary effort is normal.     Breath sounds: Normal breath sounds.  Chest:     Breasts:        Right: Normal.        Left:  Normal.     Comments: ecchymosis noted on right breast due to "injury" Abdominal:     General: Bowel sounds are normal.     Palpations: Abdomen is soft.     Tenderness: There is no abdominal tenderness.  Genitourinary:    General: Normal vulva.     Vagina: No vaginal discharge.     Comments: External GU: no rashes or lesions. Vagina: no discharge; tissue pink in color. Cervix: normal appearance. No CMT. Bimanual exam: no tenderness or obvious masses.  Lymphadenopathy:     Upper Body:     Right upper body: No axillary adenopathy.     Left upper body: No axillary adenopathy.  Skin:    General: Skin is warm and dry.  Neurological:     Mental  Status: She is alert and oriented to person, place, and time.  Psychiatric:        Mood and Affect: Mood normal.        Behavior: Behavior normal.        Thought Content: Thought content normal.        Judgment: Judgment normal.     Comments: PHQ-9: 2      Assessment and Plan   1. Wellness examination  2. Screening for cervical cancer - IGP, Aptima HPV  3. Screen for colon cancer - Ambulatory referral to Gastroenterology   Discussion today concerning hormonal therapy and the natural progression of menopause. Encouraged her to eat a healthy diet, start an exercise routine, to include yoga, before entering menopause. She is scheduled for her mammogram this Thursday, ambulatory referral for GI for her first colonoscopy. Pap smear done today, due to positive HPV 3 years ago. Last labs done May 10, 2019.  HTN: Well-controlled, compliant with current medications.  Depression: Well controlled, compliant with current medications.  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  HLD: Well controlled, compliant with current medications.  Hormonal therapy:  wonders if she is entering menopause, menstrual cycle different x1. Will consider discontinuing hormone therapy in the future.  Agrees with plan of care discussed today. Understands warning signs to seek further care: Chest pain, shortness of breath, any significant changes in health status. Understands to follow-up in 6 months for medication management, sooner if anything changes. Will notify once Pap smear results are available.

## 2019-12-11 NOTE — Patient Instructions (Signed)

## 2019-12-13 ENCOUNTER — Encounter (INDEPENDENT_AMBULATORY_CARE_PROVIDER_SITE_OTHER): Payer: Self-pay | Admitting: *Deleted

## 2019-12-13 ENCOUNTER — Ambulatory Visit (HOSPITAL_COMMUNITY)
Admission: RE | Admit: 2019-12-13 | Discharge: 2019-12-13 | Disposition: A | Discharge status: Home / Self Care | Payer: BC Managed Care – PPO | Source: Ambulatory Visit | Attending: Family Medicine | Admitting: Family Medicine

## 2019-12-13 ENCOUNTER — Other Ambulatory Visit: Payer: Self-pay

## 2019-12-13 DIAGNOSIS — Z1231 Encounter for screening mammogram for malignant neoplasm of breast: Secondary | ICD-10-CM | POA: Insufficient documentation

## 2019-12-14 ENCOUNTER — Other Ambulatory Visit: Payer: Self-pay | Admitting: Family Medicine

## 2019-12-14 DIAGNOSIS — B3731 Acute candidiasis of vulva and vagina: Secondary | ICD-10-CM | POA: Insufficient documentation

## 2019-12-14 LAB — IGP, APTIMA HPV: HPV Aptima: NEGATIVE

## 2019-12-14 MED ORDER — FLUCONAZOLE 150 MG PO TABS: 150.0000 mg | ORAL_TABLET | Freq: Once | ORAL | 0 refills | Status: AC

## 2020-05-08 LAB — COMPREHENSIVE METABOLIC PANEL
AG Ratio: 1.9 (calc) (ref 1.0–2.5)
ALT: 19 U/L (ref 6–29)
AST: 14 U/L (ref 10–35)
Albumin: 4.2 g/dL (ref 3.6–5.1)
Alkaline phosphatase (APISO): 56 U/L (ref 37–153)
BUN: 11 mg/dL (ref 7–25)
CO2: 29 mmol/L (ref 20–32)
Calcium: 9.1 mg/dL (ref 8.6–10.4)
Chloride: 101 mmol/L (ref 98–110)
Creat: 0.62 mg/dL (ref 0.50–1.05)
Globulin: 2.2 g/dL (calc) (ref 1.9–3.7)
Glucose, Bld: 95 mg/dL (ref 65–99)
Potassium: 3.4 mmol/L — ABNORMAL LOW (ref 3.5–5.3)
Sodium: 139 mmol/L (ref 135–146)
Total Bilirubin: 0.7 mg/dL (ref 0.2–1.2)
Total Protein: 6.4 g/dL (ref 6.1–8.1)

## 2020-05-08 LAB — CBC WITH DIFFERENTIAL/PLATELET
Absolute Monocytes: 602 cells/uL (ref 200–950)
Basophils Absolute: 42 cells/uL (ref 0–200)
Basophils Relative: 0.6 %
Eosinophils Absolute: 203 cells/uL (ref 15–500)
Eosinophils Relative: 2.9 %
HCT: 42.7 % (ref 35.0–45.0)
Hemoglobin: 14.2 g/dL (ref 11.7–15.5)
Lymphs Abs: 2177 cells/uL (ref 850–3900)
MCH: 27.9 pg (ref 27.0–33.0)
MCHC: 33.3 g/dL (ref 32.0–36.0)
MCV: 83.9 fL (ref 80.0–100.0)
MPV: 11.7 fL (ref 7.5–12.5)
Monocytes Relative: 8.6 %
Neutro Abs: 3976 cells/uL (ref 1500–7800)
Neutrophils Relative %: 56.8 %
Platelets: 328 10*3/uL (ref 140–400)
RBC: 5.09 10*6/uL (ref 3.80–5.10)
RDW: 13.8 % (ref 11.0–15.0)
Total Lymphocyte: 31.1 %
WBC: 7 10*3/uL (ref 3.8–10.8)

## 2020-05-08 LAB — LIPID PANEL
Cholesterol: 164 mg/dL (ref <200)
HDL: 56 mg/dL (ref >=50)
LDL Cholesterol (Calc): 89 mg/dL (calc)
Non-HDL Cholesterol (Calc): 108 mg/dL (calc) (ref <130)
Total CHOL/HDL Ratio: 2.9 (calc) (ref <5.0)
Triglycerides: 92 mg/dL (ref <150)

## 2020-05-12 ENCOUNTER — Ambulatory Visit (INDEPENDENT_AMBULATORY_CARE_PROVIDER_SITE_OTHER): Payer: BC Managed Care – PPO | Admitting: Adult Health

## 2020-05-12 ENCOUNTER — Other Ambulatory Visit: Payer: Self-pay

## 2020-05-12 ENCOUNTER — Encounter: Payer: Self-pay | Admitting: Adult Health

## 2020-05-12 VITALS — BP 133/84 | HR 84 | Ht 61.0 in | Wt 186.0 lb

## 2020-05-12 DIAGNOSIS — R232 Flushing: Secondary | ICD-10-CM

## 2020-05-12 DIAGNOSIS — N926 Irregular menstruation, unspecified: Secondary | ICD-10-CM

## 2020-05-12 DIAGNOSIS — Z780 Asymptomatic menopausal state: Secondary | ICD-10-CM | POA: Diagnosis not present

## 2020-05-12 NOTE — Patient Instructions (Signed)
https://www.womenshealth.gov/menopause/menopause-basics"> https://www.clinicalkey.com">  Menopause Menopause is the normal time of a woman's life when menstrual periods stop completely. It marks the natural end to a woman's ability to become pregnant. It can be defined as the absence of a menstrual period for 12 months without another medical cause. The transition to menopause (perimenopause) most often happens between the ages of 45 and 55, and can last for many years. During perimenopause, hormone levels change in your body, which can cause symptoms and affect your health. Menopause may increase your risk for:  Weakened bones (osteoporosis), which causes fractures.  Depression.  Hardening and narrowing of the arteries (atherosclerosis), which can cause heart attacks and strokes. What are the causes? This condition is usually caused by a natural change in hormone levels that happens as you get older. The condition may also be caused by changes that are not natural, including:  Surgery to remove both ovaries (surgical menopause).  Side effects from some medicines, such as chemotherapy used to treat cancer (chemical menopause). What increases the risk? This condition is more likely to start at an earlier age if you have certain medical conditions or have undergone treatments, including:  A tumor of the pituitary gland in the brain.  A disease that affects the ovaries and hormones.  Certain cancer treatments, such as chemotherapy or hormone therapy, or radiation therapy on the pelvis.  Heavy smoking and excessive alcohol use.  Family history of early menopause. This condition is also more likely to develop earlier in women who are very thin. What are the signs or symptoms? Symptoms of this condition include:  Hot flashes.  Irregular menstrual periods.  Night sweats.  Changes in feelings about sex. This could be a decrease in sex drive or an increased discomfort around your  sexuality.  Vaginal dryness and thinning of the vaginal walls. This may cause painful sex.  Dryness of the skin and development of wrinkles.  Headaches.  Problems sleeping (insomnia).  Mood swings or irritability.  Memory problems.  Weight gain.  Hair growth on the face and chest.  Bladder infections or problems with urinating. How is this diagnosed? This condition is diagnosed based on your medical history, a physical exam, your age, your menstrual history, and your symptoms. Hormone tests may also be done. How is this treated? In some cases, no treatment is needed. You and your health care provider should make a decision together about whether treatment is necessary. Treatment will be based on your individual condition and preferences. Treatment for this condition focuses on managing symptoms. Treatment may include:  Menopausal hormone therapy (MHT).  Medicines to treat specific symptoms or complications.  Acupuncture.  Vitamin or herbal supplements. Before starting treatment, make sure to let your health care provider know if you have a personal or family history of these conditions:  Heart disease.  Breast cancer.  Blood clots.  Diabetes.  Osteoporosis. Follow these instructions at home: Lifestyle  Do not use any products that contain nicotine or tobacco, such as cigarettes, e-cigarettes, and chewing tobacco. If you need help quitting, ask your health care provider.  Get at least 30 minutes of physical activity on 5 or more days each week.  Avoid alcoholic and caffeinated beverages, as well as spicy foods. This may help prevent hot flashes.  Get 7-8 hours of sleep each night.  If you have hot flashes, try: ? Dressing in layers. ? Avoiding things that may trigger hot flashes, such as spicy food, warm places, or stress. ? Taking slow, deep   breaths when a hot flash starts. ? Keeping a fan in your home and office.  Find ways to manage stress, such as deep  breathing, meditation, or journaling.  Consider going to group therapy with other women who are having menopause symptoms. Ask your health care provider about recommended group therapy meetings. Eating and drinking  Eat a healthy, balanced diet that contains whole grains, lean protein, low-fat dairy, and plenty of fruits and vegetables.  Your health care provider may recommend adding more soy to your diet. Foods that contain soy include tofu, tempeh, and soy milk.  Eat plenty of foods that contain calcium and vitamin D for bone health. Items that are rich in calcium include low-fat milk, yogurt, beans, almonds, sardines, broccoli, and kale.   Medicines  Take over-the-counter and prescription medicines only as told by your health care provider.  Talk with your health care provider before starting any herbal supplements. If prescribed, take vitamins and supplements as told by your health care provider. General instructions  Keep track of your menstrual periods, including: ? When they occur. ? How heavy they are and how long they last. ? How much time passes between periods.  Keep track of your symptoms, noting when they start, how often you have them, and how long they last.  Use vaginal lubricants or moisturizers to help with vaginal dryness and improve comfort during sex.  Keep all follow-up visits. This is important. This includes any group therapy or counseling.   Contact a health care provider if:  You are still having menstrual periods after age 58.  You have pain during sex.  You have not had a period for 12 months and you develop vaginal bleeding. Get help right away if you have:  Severe depression.  Excessive vaginal bleeding.  Pain when you urinate.  A fast or irregular heartbeat (palpitations).  Severe headaches.  Abdominal pain or severe indigestion. Summary  Menopause is a normal time of life when menstrual periods stop completely. It is usually defined as  the absence of a menstrual period for 12 months without another medical cause.  The transition to menopause (perimenopause) most often happens between the ages of 47 and 2 and can last for several years.  Symptoms can be managed through medicines, lifestyle changes, and complementary therapies such as acupuncture.  Eat a balanced diet that is rich in nutrients to promote bone health and heart health and to manage symptoms during menopause. This information is not intended to replace advice given to you by your health care provider. Make sure you discuss any questions you have with your health care provider. Document Revised: 10/19/2019 Document Reviewed: 07/05/2019 Elsevier Patient Education  2021 Elsevier Inc. Menopause and Hormone Replacement Therapy Menopause is a normal time of life when menstrual periods stop completely and the ovaries stop producing the female hormones estrogen and progesterone. Low levels of these hormones can affect your health and cause symptoms. Hormone replacement therapy (HRT) can relieve some of those symptoms. HRT is the use of artificial (synthetic) hormones to replace hormones that your body has stopped producing because you have reached menopause. Types of HRT HRT may consist of the synthetic hormones estrogen and progestin, or it may consist of estrogen-only therapy. You and your health care provider will decide which form of HRT is best for you. If you choose to be on HRT and you have a uterus, estrogen and progestin are usually prescribed. Estrogen-only therapy is used for women who do not have a uterus.  Possible options for taking HRT include:  Pills.  Patches.  Gels.  Sprays.  Vaginal cream.  Vaginal rings.  Vaginal inserts. The amount of hormones that you take and how long you take them varies according to your health. It is important to:  Begin HRT with the lowest possible dosage.  Stop HRT as soon as your health care provider tells you to  stop.  Work with your health care provider so that you feel informed and comfortable with your decisions.   Tell a health care provider about:  Any allergies you have.  Whether you have had blood clots or know of any risk factors you may have for blood clots.  Whether you or family members have had cancer, especially cancer of the breasts, ovaries, or uterus.  Any surgeries you have had.  All medicines you are taking, including vitamins, herbs, eye drops, creams, and over-the-counter medicines.  Whether you are pregnant or may be pregnant.  Any medical conditions you have. What are the benefits? HRT can reduce the frequency and severity of menopausal symptoms. Benefits of HRT vary according to the kind of symptoms that you have, how severe they are, and your overall health. HRT may help to improve the following symptoms of menopause:  Hot flashes and night sweats. These are sudden feelings of heat that spread over the face and body. The skin may turn red, like a blush. Night sweats are hot flashes that happen while you are sleeping or trying to sleep.  Bone loss (osteoporosis). The body loses calcium more quickly after menopause, causing the bones to become weaker. This can increase the risk for bone breaks (fractures).  Vaginal dryness. The lining of the vagina can become thin and dry, which can cause pain during sex or cause infection, burning, or itching.  Urinary tract infections.  Urinary incontinence. This is the inability to control when you urinate.  Irritability.  Short-term memory problems. What are the risks? Risks of HRT vary depending on your individual health and medical history. Risks of HRT also depend on whether you receive both estrogen and progestin or you receive estrogen only. HRT may increase the risk of:  Spotting. This is when a small amount of blood leaks from the vagina unexpectedly.  Endometrial cancer. This cancer is in the lining of the uterus  (endometrium).  Breast cancer.  Increased density of breast tissue. This can make it harder to find breast cancer on a breast X-ray (mammogram).  Stroke.  Heart disease.  Blood clots.  Gallbladder disease or liver disease. Risks of HRT can increase if you have any of the following conditions:  Endometrial cancer.  Liver disease.  Heart disease.  Breast cancer.  History of blood clots.  History of stroke. Follow these instructions at home: Pap tests  Have Pap tests done as often as told by your health care provider. A Pap test is sometimes called a Pap smear. It is a screening test that is used to check for signs of cancer of the cervix and vagina. A Pap test can also identify the presence of infection or precancerous changes. Pap tests may be done: ? Every 3 years, starting at age 21. ? Every 5 years, starting after age 40, in combination with testing for human papillomavirus (HPV). ? More often or less often depending on other medical conditions you have, your age, and other risk factors.  It is up to you to get the results of your Pap test. Ask your health care provider,  or the department that is doing the test, when your results will be ready. General instructions  Take over-the-counter and prescription medicines only as told by your health care provider.  Do not use any products that contain nicotine or tobacco. These products include cigarettes, chewing tobacco, and vaping devices, such as e-cigarettes. If you need help quitting, ask your health care provider.  Get mammograms, pelvic exams, and medical checkups as often as told by your health care provider.  Keep all follow-up visits. This is important. Contact a health care provider if you have:  Pain or swelling in your legs.  Lumps or changes in your breasts or armpits.  Pain, burning, or bleeding when you urinate.  Unusual vaginal bleeding.  Dizziness or headaches.  Pain in your abdomen. Get help  right away if you have:  Shortness of breath.  Chest pain.  Slurred speech.  Weakness or numbness in any part of your arms or legs. These symptoms may represent a serious problem that is an emergency. Do not wait to see if the symptoms will go away. Get medical help right away. Call your local emergency services (911 in the [place] Do not drive yourself to the hospital. Summary  Menopause is a normal time of life when menstrual periods stop completely and the ovaries stop producing the female hormones estrogen and progesterone.  HRT can reduce the frequency and severity of menopausal symptoms.  Risks of HRT vary depending on your individual health and medical history. This information is not intended to replace advice given to you by your health care provider. Make sure you discuss any questions you have with your health care provider. Document Revised: 07/23/2019 Document Reviewed: 07/23/2019 Elsevier Patient Education  2021 Elsevier Inc.  

## 2020-05-12 NOTE — Progress Notes (Addendum)
  Subjective:     Patient ID: Gabrielle Little, female   DOB: 10/10/1965, 55 y.o.   MRN: [id number]  HPI Gabrielle Little is a 55 year old white female, married, [id number], in to discuss menopause. She is having some hot flashes and wakes up between 1-3 am, and periods are lite and irregular, she is on Micronor. She has not been 365 days without a period. She had pap and physical 12/11/19 at PCP. Pap was normal with negative HPV. She has retired from Theatre stage manager at [employer]. PCP is Dr Ladona Ridgel.  Review of Systems  +irregular periods +occsional hot flash +sleep disturbance Reviewed past medical,surgical, social and family history. Reviewed medications and allergies.     Objective:   Physical Exam BP 133/84 (BP Location: Left Arm, Patient Position: Sitting, Cuff Size: Normal)   Pulse 84   Ht 5\' 1"  (1.549 m)   Wt 186 lb (84.4 kg)   LMP 04/14/2020 (Approximate)   BMI 35.14 kg/m  Skin warm and dry. Neck: mid line trachea, normal thyroid, good ROM, no lymphadenopathy noted. Lungs: clear to ausculation bilaterally. Cardiovascular: regular rate and rhythm. AA is 0  Fall risk is low PHQ 9 score is 1,on prozac GAD 7 score is 2  [facility] - 05/12/20 1454      Pregnancy Intention Screening   Does the patient want to become pregnant in the next year? N/A    Does the patient's partner want to become pregnant in the next year? N/A    Would the patient like to discuss contraceptive options today? N/A      Contraception Wrap Up   Current Method Female Sterilization    End Method Female Sterilization    Contraception Counseling Provided No             Assessment:     1. Irregular periods Will get pelvic 07/12/20 to assess uterus US scheduled for [place] 05/16/20 at 12:30 pm  Continue Micronor for now  2. Menopause -discussed HRT as possible option  3. Hot flashes     Plan:     Will talk when 05/18/20 results back Review handout on menopause and menopause and HRT

## 2020-05-15 ENCOUNTER — Telehealth: Payer: Self-pay | Admitting: Adult Health

## 2020-05-15 ENCOUNTER — Telehealth: Payer: Self-pay

## 2020-05-15 NOTE — Telephone Encounter (Signed)
Please advise. Thank you

## 2020-05-15 NOTE — Telephone Encounter (Signed)
Can she see them in mychart? We use the time at her next visit to review them. Thx. Dr. Ladona Ridgel

## 2020-05-15 NOTE — Telephone Encounter (Signed)
Overall stable.  Will discuss at the appt.  Thx. Dr. Ladona Ridgel

## 2020-05-15 NOTE — Telephone Encounter (Signed)
Left message to return call 

## 2020-05-15 NOTE — Telephone Encounter (Signed)
Yes can get [place] if spotting and it is internal too

## 2020-05-15 NOTE — Telephone Encounter (Signed)
Has U/S tomorrow @ [facility], has started spotting & wonders if it OK to still get U/S? Is this an internal or external U/S?  Please advise & call pt

## 2020-05-15 NOTE — Telephone Encounter (Signed)
Pt wants to know what her lab results are before she comes to her visit in May.   Pt call back [phone]

## 2020-05-15 NOTE — Telephone Encounter (Signed)
Pt does not have my chart. Please advise. Thank you

## 2020-05-16 ENCOUNTER — Ambulatory Visit (HOSPITAL_COMMUNITY)
Admission: RE | Admit: 2020-05-16 | Discharge: 2020-05-16 | Disposition: A | Discharge status: Home / Self Care | Payer: BC Managed Care – PPO | Source: Ambulatory Visit | Attending: Adult Health | Admitting: Adult Health

## 2020-05-16 ENCOUNTER — Other Ambulatory Visit: Payer: Self-pay

## 2020-05-16 DIAGNOSIS — N926 Irregular menstruation, unspecified: Secondary | ICD-10-CM | POA: Insufficient documentation

## 2020-05-16 DIAGNOSIS — Z780 Asymptomatic menopausal state: Secondary | ICD-10-CM | POA: Insufficient documentation

## 2020-05-19 ENCOUNTER — Telehealth: Payer: Self-pay | Admitting: Adult Health

## 2020-05-19 NOTE — Telephone Encounter (Signed)
Pt contacted and verbalized understanding.  

## 2020-05-19 NOTE — Telephone Encounter (Signed)
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 EEC 7 mm

## 2020-06-09 ENCOUNTER — Telehealth: Payer: BC Managed Care – PPO | Admitting: Family Medicine

## 2020-06-18 ENCOUNTER — Encounter: Payer: Self-pay | Admitting: Family Medicine

## 2020-06-18 ENCOUNTER — Ambulatory Visit (INDEPENDENT_AMBULATORY_CARE_PROVIDER_SITE_OTHER): Payer: BC Managed Care – PPO | Admitting: Family Medicine

## 2020-06-18 ENCOUNTER — Other Ambulatory Visit: Payer: Self-pay

## 2020-06-18 VITALS — BP 145/73 | HR 87 | Temp 98.3°F | Ht 61.0 in | Wt 185.0 lb

## 2020-06-18 DIAGNOSIS — Z23 Encounter for immunization: Secondary | ICD-10-CM

## 2020-06-18 DIAGNOSIS — E785 Hyperlipidemia, unspecified: Secondary | ICD-10-CM

## 2020-06-18 DIAGNOSIS — F419 Anxiety disorder, unspecified: Secondary | ICD-10-CM

## 2020-06-18 DIAGNOSIS — I10 Essential (primary) hypertension: Secondary | ICD-10-CM

## 2020-06-18 MED ORDER — PRAVASTATIN SODIUM 20 MG PO TABS: ORAL_TABLET | ORAL | 1 refills | Status: DC

## 2020-06-18 MED ORDER — POTASSIUM CHLORIDE ER 10 MEQ PO TBCR: 10.0000 meq | EXTENDED_RELEASE_TABLET | Freq: Every day | ORAL | 1 refills | Status: DC

## 2020-06-18 MED ORDER — FLUOXETINE HCL 40 MG PO CAPS: ORAL_CAPSULE | ORAL | 1 refills | Status: DC

## 2020-06-18 MED ORDER — HYDROCHLOROTHIAZIDE 25 MG PO TABS: ORAL_TABLET | ORAL | 1 refills | Status: DC

## 2020-06-18 MED ORDER — AMLODIPINE BESYLATE 10 MG PO TABS: ORAL_TABLET | ORAL | 1 refills | Status: DC

## 2020-06-18 NOTE — Progress Notes (Signed)
Patient ID: Gabrielle Little, female    DOB: 12-Nov-1965, 55 y.o.   MRN: [id number]   Chief Complaint  Patient presents with  . Hypertension   Subjective:    HPI   pt arrives for a med check up. No concerns today.   HTN Pt compliant with BP meds.  No SEs Denies chest pain, sob, LE swelling, or blurry vision.   Went to gyn to see NP for irregular periods and menopause. Pt taking micronor. Wondering if cycles will stop all together.  Getting irregular, now only getting cycle every 2-3 months.  Anxiety- doing well with medications.  No new concerns.  HLD- doing well no new concerns.  Compliant with meds. No chest pain, palpitations, myalgias or joint pains.  Medical History Lua has a past medical history of Allergic rhinitis, Anxiety, Hyperlipidemia, and Hypertension.   Outpatient Encounter Medications as of 06/18/2020  Medication Sig  . Lysine 500 MG TABS Take by mouth.  Marland Kitchen MAGNESIUM PO Take 400 mg by mouth.  . norethindrone (MICRONOR) 0.35 MG tablet TAKE ONE (1) TABLET BY MOUTH EVERY DAY  . potassium chloride (KLOR-CON 10) 10 MEQ tablet Take 1 tablet (10 mEq total) by mouth daily.  . [DISCONTINUED] amLODipine (NORVASC) 10 MG tablet TAKE ONE (1) TABLET BY MOUTH EVERY DAY  . [DISCONTINUED] FLUoxetine (PROZAC) 40 MG capsule TAKE ONE CAPSULE (40MG  TOTAL) BY MOUTH DAILY  . [DISCONTINUED] hydrochlorothiazide (HYDRODIURIL) 25 MG tablet TAKE ONE TABLET (25MG  TOTAL) BY MOUTH DAILY  . [DISCONTINUED] pravastatin (PRAVACHOL) 20 MG tablet TAKE ONE BY MOUTH AFTER EVENING MEAL  . amLODipine (NORVASC) 10 MG tablet TAKE ONE (1) TABLET BY MOUTH EVERY DAY  . FLUoxetine (PROZAC) 40 MG capsule TAKE ONE CAPSULE (40MG  TOTAL) BY MOUTH DAILY  . hydrochlorothiazide (HYDRODIURIL) 25 MG tablet TAKE ONE TABLET (25MG  TOTAL) BY MOUTH DAILY  . pravastatin (PRAVACHOL) 20 MG tablet TAKE ONE BY MOUTH AFTER EVENING MEAL   No facility-administered encounter medications on file as of 06/18/2020.      Review of Systems  Constitutional: Negative for chills and fever.  HENT: Negative for congestion, rhinorrhea and sore throat.   Respiratory: Negative for cough, shortness of breath and wheezing.   Cardiovascular: Negative for chest pain and leg swelling.  Gastrointestinal: Negative for abdominal pain, diarrhea, nausea and vomiting.  Genitourinary: Positive for menstrual problem. Negative for dysuria and frequency.  Musculoskeletal: Negative for arthralgias and back pain.  Skin: Negative for rash.  Neurological: Negative for dizziness, weakness and headaches.     Vitals BP (!) 145/73   Pulse 87   Temp 98.3 F (36.8 C)   Ht 5\' 1"  (1.549 m)   Wt 185 lb (83.9 kg)   SpO2 96%   BMI 34.96 kg/m   Objective:   Physical Exam Vitals and nursing note reviewed.  Constitutional:      General: She is not in acute distress.    Appearance: Normal appearance.  HENT:     Head: Normocephalic and atraumatic.  Cardiovascular:     Rate and Rhythm: Normal rate and regular rhythm.     Pulses: Normal pulses.     Heart sounds: Normal heart sounds.  Pulmonary:     Effort: Pulmonary effort is normal.     Breath sounds: Normal breath sounds. No wheezing, rhonchi or rales.  Musculoskeletal:        General: Normal range of motion.     Right lower leg: No edema.     Left lower leg: No edema.  Skin:    General: Skin is warm and dry.     Findings: No lesion or rash.  Neurological:     General: No focal deficit present.     Mental Status: She is alert and oriented to person, place, and time.     Cranial Nerves: No cranial nerve deficit.  Psychiatric:        Mood and Affect: Mood normal.        Behavior: Behavior normal.        Thought Content: Thought content normal.        Judgment: Judgment normal.      Assessment and Plan   1. Primary hypertension - amLODipine (NORVASC) 10 MG tablet; TAKE ONE (1) TABLET BY MOUTH EVERY DAY  Dispense: 90 tablet; Refill: 1 - hydrochlorothiazide  (HYDRODIURIL) 25 MG tablet; TAKE ONE TABLET (25MG  TOTAL) BY MOUTH DAILY  Dispense: 90 tablet; Refill: 1  2. Need for vaccination - Tdap vaccine greater than or equal to 7yo IM  3. Hyperlipidemia, unspecified hyperlipidemia type - pravastatin (PRAVACHOL) 20 MG tablet; TAKE ONE BY MOUTH AFTER EVENING MEAL  Dispense: 90 tablet; Refill: 1  4. Anxiety - FLUoxetine (PROZAC) 40 MG capsule; TAKE ONE CAPSULE (40MG  TOTAL) BY MOUTH DAILY  Dispense: 90 capsule; Refill: 1   htn- suboptimal.  Cont to dec salt in diet and cont meds.  Hm- updated tdap today. hld- stable. At goal.  Cont meds. Anxiety- stable. Cont prozac.   Return in about 6 months (around 12/19/2020).   BP Readings from Last 3 Encounters:  06/18/20 (!) 145/73  05/12/20 133/84  12/11/19 132/82   Wt Readings from Last 3 Encounters:  06/18/20 185 lb (83.9 kg)  05/12/20 186 lb (84.4 kg)  12/11/19 188 lb 9.6 oz (85.5 kg)     07/02/2020

## 2020-07-02 ENCOUNTER — Encounter: Payer: Self-pay | Admitting: Family Medicine

## 2020-09-01 ENCOUNTER — Other Ambulatory Visit: Payer: Self-pay | Admitting: Family Medicine

## 2020-09-04 ENCOUNTER — Other Ambulatory Visit: Payer: Self-pay | Admitting: Adult Health

## 2021-01-03 ENCOUNTER — Other Ambulatory Visit: Payer: Self-pay | Admitting: Family Medicine

## 2021-01-05 ENCOUNTER — Other Ambulatory Visit: Payer: Self-pay | Admitting: Family Medicine

## 2021-01-05 DIAGNOSIS — F419 Anxiety disorder, unspecified: Secondary | ICD-10-CM

## 2021-01-06 ENCOUNTER — Telehealth: Payer: Self-pay | Admitting: Family Medicine

## 2021-01-06 DIAGNOSIS — F419 Anxiety disorder, unspecified: Secondary | ICD-10-CM

## 2021-01-06 MED ORDER — FLUOXETINE HCL 40 MG PO CAPS: ORAL_CAPSULE | ORAL | 1 refills | Status: DC

## 2021-01-06 MED ORDER — POTASSIUM CHLORIDE ER 10 MEQ PO TBCR
10.0000 meq | EXTENDED_RELEASE_TABLET | Freq: Every day | ORAL | 1 refills | Status: DC
Start: 2021-01-06 — End: 2021-07-14

## 2021-01-06 NOTE — Telephone Encounter (Signed)
Pt returned call and verbalized understanding  

## 2021-01-06 NOTE — Telephone Encounter (Signed)
Medications sent to pharmacy. Left message to return call

## 2021-01-06 NOTE — Telephone Encounter (Signed)
Patient has appointment on 12/13 but is out of potassium 10 mg , fluoxetine HCI 40 mg. Can you call in enough until she seen. Last filled 06/18/20 and last seen 06/18/20. [facility]

## 2021-01-13 ENCOUNTER — Other Ambulatory Visit: Payer: Self-pay

## 2021-01-13 ENCOUNTER — Ambulatory Visit: Payer: BC Managed Care – PPO | Admitting: Family Medicine

## 2021-01-13 ENCOUNTER — Encounter: Payer: Self-pay | Admitting: Family Medicine

## 2021-01-13 DIAGNOSIS — G4733 Obstructive sleep apnea (adult) (pediatric): Secondary | ICD-10-CM

## 2021-01-13 DIAGNOSIS — F419 Anxiety disorder, unspecified: Secondary | ICD-10-CM | POA: Diagnosis not present

## 2021-01-13 DIAGNOSIS — I10 Essential (primary) hypertension: Secondary | ICD-10-CM

## 2021-01-13 DIAGNOSIS — E785 Hyperlipidemia, unspecified: Secondary | ICD-10-CM

## 2021-01-13 NOTE — Patient Instructions (Signed)
I will refill you meds.  Mammogram and Colonoscopy.  I will look into the sleep study to see if we can avoid it at the hospital.  Follow up in 6 months.  Take care  Dr. Adriana Simas

## 2021-01-14 ENCOUNTER — Other Ambulatory Visit: Payer: Self-pay | Admitting: Family Medicine

## 2021-01-14 DIAGNOSIS — G4733 Obstructive sleep apnea (adult) (pediatric): Secondary | ICD-10-CM

## 2021-01-14 MED ORDER — AMLODIPINE BESYLATE 10 MG PO TABS: ORAL_TABLET | ORAL | 1 refills | Status: DC

## 2021-01-14 MED ORDER — HYDROCHLOROTHIAZIDE 25 MG PO TABS: ORAL_TABLET | ORAL | 1 refills | Status: DC

## 2021-01-14 MED ORDER — FLUOXETINE HCL 40 MG PO CAPS: ORAL_CAPSULE | ORAL | 1 refills | Status: DC

## 2021-01-14 MED ORDER — PRAVASTATIN SODIUM 20 MG PO TABS: ORAL_TABLET | ORAL | 1 refills | Status: DC

## 2021-01-14 NOTE — Assessment & Plan Note (Signed)
-   Stable; Continue Prozac 

## 2021-01-14 NOTE — Assessment & Plan Note (Addendum)
Stable. Continue amlodipine and HCTZ. 

## 2021-01-14 NOTE — Progress Notes (Signed)
error 

## 2021-01-14 NOTE — Progress Notes (Signed)
Subjective:  Patient ID: Gabrielle Little, female    DOB: [birth year]  Age: 55 y.o. MRN: [id number]  CC: Chief Complaint  Patient presents with   Establish Care    Pt states she is ok on refills until the middle of January.     HPI:  55 year old female with hypertension, hyperlipidemia, anxiety presents to establish care with me.  Patient's hypertension has been well controlled.  She notes that her blood pressure is slightly elevated today.  This is higher than her normal readings.  She is compliant with amlodipine and hydrochlorothiazide.  Most recent labs reveal good control of hyperlipidemia.  She is compliant with statin therapy and reports no side effects.  Anxiety is stable on Prozac.  Needs refill.  Patient notes that she has a history of obstructive sleep apnea.  She states that she has not been using her CPAP for quite some time as she felt like it was not working well.  She states that she would like to restart using his CPAP.  She she is unsure of whether she needs an additional sleep study and how to get a new CPAP device.  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   Anxiety 07/26/2014   Hypertension 07/20/2012   Hyperlipemia 07/20/2012    Social Hx   Social History   Socioeconomic History   Marital status: Married    Spouse name: Not on file   Number of children: Not on file   Years of education: Not on file   Highest education level: Not on file  Occupational History   Not on file  Tobacco Use   Smoking status: Never   Smokeless tobacco: Never  Vaping Use   Vaping Use: Never used  Substance and Sexual Activity   Alcohol use: No   Drug use: No   Sexual activity: Yes    Birth control/protection: Surgical    Comment: tubal  Other Topics Concern   Not on file  Social History Narrative   Not on file   Social Determinants of Health   Financial Resource Strain:  Low Risk    Difficulty of Paying Living Expenses: Not very hard  Food Insecurity: No Food Insecurity   Worried About Running Out of Food in the Last Year: Never true   Ran Out of Food in the Last Year: Never true  Transportation Needs: No Transportation Needs   Lack of Transportation (Medical): No   Lack of Transportation (Non-Medical): No  Physical Activity: Insufficiently Active   Days of Exercise per Week: 1 day   Minutes of Exercise per Session: 20 min  Stress: No Stress Concern Present   Feeling of Stress : Only a little  Social Connections: Press photographer of Communication with Friends and Family: Three times a week   Frequency of Social Gatherings with Friends and Family: Twice a week   Attends Religious Services: More than 4 times per year   Active Member of [employer] or Organizations: Yes   Attends Banker Meetings: 1 to 4 times per year   Marital Status: Married    Review of Systems  Constitutional: Negative.   Respiratory: Negative.    Cardiovascular: Negative.     Objective:  BP 134/86    Pulse 83    Temp 98.2 F (36.8 C)    Ht 5\' 1"  (1.549 m)    Wt 189 lb  6.4 oz (85.9 kg)    SpO2 99%    BMI 35.79 kg/m   BP/Weight 01/13/2021 06/18/2020 05/12/2020  Systolic BP 134 145 133  Diastolic BP 86 73 84  Wt. (Lbs) 189.4 185 186  BMI 35.79 34.96 35.14    Physical Exam Vitals and nursing note reviewed.  Constitutional:      General: She is not in acute distress.    Appearance: Normal appearance. She is not ill-appearing.  HENT:     Head: Normocephalic and atraumatic.  Eyes:     General:        Right eye: No discharge.        Left eye: No discharge.     Conjunctiva/sclera: Conjunctivae normal.  Cardiovascular:     Rate and Rhythm: Normal rate and regular rhythm.  Pulmonary:     Effort: Pulmonary effort is normal.     Breath sounds: Normal breath sounds. No wheezing or rhonchi.  Neurological:     Mental Status: She is alert.   Psychiatric:        Mood and Affect: Mood normal.        Behavior: Behavior normal.    Lab Results  Component Value Date   WBC 7.0 05/07/2020   HGB 14.2 05/07/2020   HCT 42.7 05/07/2020   PLT 328 05/07/2020   GLUCOSE 95 05/07/2020   CHOL 164 05/07/2020   TRIG 92 05/07/2020   HDL 56 05/07/2020   LDLCALC 89 05/07/2020   ALT 19 05/07/2020   AST 14 05/07/2020   NA 139 05/07/2020   K 3.4 (L) 05/07/2020   CL 101 05/07/2020   CREATININE 0.62 05/07/2020   BUN 11 05/07/2020   CO2 29 05/07/2020   TSH 3.462 08/18/2012     Assessment & Plan:   Problem List Items Addressed This Visit       Cardiovascular and Mediastinum   Hypertension    Stable.  Continue amlodipine and HCTZ.      Relevant Medications   amLODipine (NORVASC) 10 MG tablet   hydrochlorothiazide (HYDRODIURIL) 25 MG tablet   pravastatin (PRAVACHOL) 20 MG tablet     Respiratory   OSA (obstructive sleep apnea)    We will see if we can arrange a home test and then proceed with new CPAP device.        Other   Hyperlipemia    Stable.  Continue pravastatin.      Relevant Medications   amLODipine (NORVASC) 10 MG tablet   hydrochlorothiazide (HYDRODIURIL) 25 MG tablet   pravastatin (PRAVACHOL) 20 MG tablet   Anxiety    Stable.  Continue Prozac.      Relevant Medications   FLUoxetine (PROZAC) 40 MG capsule    Meds ordered this encounter  Medications   amLODipine (NORVASC) 10 MG tablet    Sig: TAKE ONE (1) TABLET BY MOUTH EVERY DAY    Dispense:  90 tablet    Refill:  1   FLUoxetine (PROZAC) 40 MG capsule    Sig: TAKE ONE CAPSULE (40MG  TOTAL) BY MOUTH DAILY    Dispense:  90 capsule    Refill:  1   hydrochlorothiazide (HYDRODIURIL) 25 MG tablet    Sig: TAKE ONE TABLET (25MG  TOTAL) BY MOUTH DAILY    Dispense:  90 tablet    Refill:  1   pravastatin (PRAVACHOL) 20 MG tablet    Sig: TAKE ONE BY MOUTH AFTER EVENING MEAL    Dispense:  90 tablet    Refill:  1  Follow-up:  Return in about 6 months  (around 07/14/2021).  Everlene Other DO [facility]

## 2021-01-14 NOTE — Assessment & Plan Note (Signed)
Stable.  Continue pravastatin. 

## 2021-01-14 NOTE — Assessment & Plan Note (Signed)
We will see if we can arrange a home test and then proceed with new CPAP device.

## 2021-01-16 ENCOUNTER — Telehealth: Payer: Self-pay | Admitting: Family Medicine

## 2021-01-16 NOTE — Telephone Encounter (Signed)
Pt wanting do home sleep study. Form filled out and faxed to [facility] with Nilsa Nutting).

## 2021-01-20 ENCOUNTER — Telehealth: Payer: Self-pay | Admitting: Family Medicine

## 2021-01-20 NOTE — Telephone Encounter (Signed)
Pt husband, Tiburcio Bash, [phone], called and stated his wife was seen 01/13/21 and requested a referral for Dr Abbott Pao as he requires a referral. I contacted Toni Amend in referrals and she has not received anything from here.

## 2021-01-21 ENCOUNTER — Other Ambulatory Visit: Payer: Self-pay | Admitting: Family Medicine

## 2021-01-21 DIAGNOSIS — Z1211 Encounter for screening for malignant neoplasm of colon: Secondary | ICD-10-CM

## 2021-01-21 NOTE — Telephone Encounter (Signed)
Cook, Jayce G, DO   Referral placed. 

## 2021-01-22 ENCOUNTER — Encounter (INDEPENDENT_AMBULATORY_CARE_PROVIDER_SITE_OTHER): Payer: Self-pay | Admitting: *Deleted

## 2021-02-06 ENCOUNTER — Ambulatory Visit (INDEPENDENT_AMBULATORY_CARE_PROVIDER_SITE_OTHER): Payer: BC Managed Care – PPO | Admitting: Family Medicine

## 2021-02-06 ENCOUNTER — Telehealth: Payer: Self-pay | Admitting: *Deleted

## 2021-02-06 ENCOUNTER — Other Ambulatory Visit: Payer: Self-pay

## 2021-02-06 VITALS — HR 86 | Temp 98.2°F

## 2021-02-06 DIAGNOSIS — J020 Streptococcal pharyngitis: Secondary | ICD-10-CM

## 2021-02-06 DIAGNOSIS — J029 Acute pharyngitis, unspecified: Secondary | ICD-10-CM | POA: Diagnosis not present

## 2021-02-06 LAB — POCT RAPID STREP A (OFFICE): Rapid Strep A Screen: POSITIVE — AB

## 2021-02-06 MED ORDER — AMOXICILLIN 500 MG PO CAPS: 500.0000 mg | ORAL_CAPSULE | Freq: Three times a day (TID) | ORAL | 0 refills | Status: AC

## 2021-02-06 NOTE — Progress Notes (Signed)
° °  Subjective:    Patient ID: Gabrielle Little, female    DOB: [birth year], 56 y.o.   MRN: [id number]  HPI Patient is have sore throat, feeling achy and having a headache. She states she has taken some advil and feels better. No fever, shortness of breath or difficulty breathing. No runny nose Thinks it could be strep  Virtual Visit via Telephone Note  I connected with Gabrielle Little on 02/06/21 at 10:00 AM EST by telephone and verified that I am speaking with the correct person using two identifiers.  Location: Patient: home Provider: office   I discussed the limitations, risks, security and privacy concerns of performing an evaluation and management service by telephone and the availability of in person appointments. I also discussed with the patient that there may be a patient responsible charge related to this service. The patient expressed understanding and agreed to proceed.   History of Present Illness:    Observations/Objective:   Assessment and Plan:   Follow Up Instructions:    I discussed the assessment and treatment plan with the patient. The patient was provided an opportunity to ask questions and all were answered. The patient agreed with the plan and demonstrated an understanding of the instructions.   The patient was advised to call back or seek an in-person evaluation if the symptoms worsen or if the condition fails to improve as anticipated.  I provided  15 minutes of non-face-to-face time during this encounter.     Review of Systems     Objective:   Physical Exam  Today's visit was via telephone Physical exam was not possible for this visit       Assessment & Plan:  1. Sore throat Rapid/ back up Triple swab  Rapid strep came back positive She was treated with antibiotics I did discuss with her what to watch for and warning signs of peritonsillar abscess

## 2021-02-06 NOTE — Telephone Encounter (Signed)
[name], hermans are scheduled for a virtual visit with your provider today.    Just as we do with appointments in the office, we must obtain your consent to participate.  Your consent will be active for this visit and any virtual visit you may have with one of our providers in the next 365 days.    If you have a MyChart account, I can also send a copy of this consent to you electronically.  All virtual visits are billed to your insurance company just like a traditional visit in the office.  As this is a virtual visit, video technology does not allow for your provider to perform a traditional examination.  This may limit your provider's ability to fully assess your condition.  If your provider identifies any concerns that need to be evaluated in person or the need to arrange testing such as labs, EKG, etc, we will make arrangements to do so.    Although advances in technology are sophisticated, we cannot ensure that it will always work on either your end or our end.  If the connection with a video visit is poor, we may have to switch to a telephone visit.  With either a video or telephone visit, we are not always able to ensure that we have a secure connection.   I need to obtain your verbal consent now.   Are you willing to proceed with your visit today?   Gabrielle Little has provided verbal consent on 02/06/2021 for a virtual visit (video or telephone).   Rocco Serene, LPN 07/11/6787  3:81 AM

## 2021-02-07 LAB — COVID-19, FLU A+B AND RSV
Influenza A, NAA: NOT DETECTED
Influenza B, NAA: NOT DETECTED
RSV, NAA: NOT DETECTED
SARS-CoV-2, NAA: NOT DETECTED

## 2021-02-07 IMAGING — MG DIGITAL SCREENING BILAT W/ TOMO W/ CAD
6 of 10 series · 6 of 30 positions shown · non-contrast
Comparison: Previous exam(s).

CLINICAL DATA: Screening.

EXAM:
DIGITAL SCREENING BILATERAL MAMMOGRAM WITH TOMO AND CAD

[R MLO synth-2D (1 of 2)]
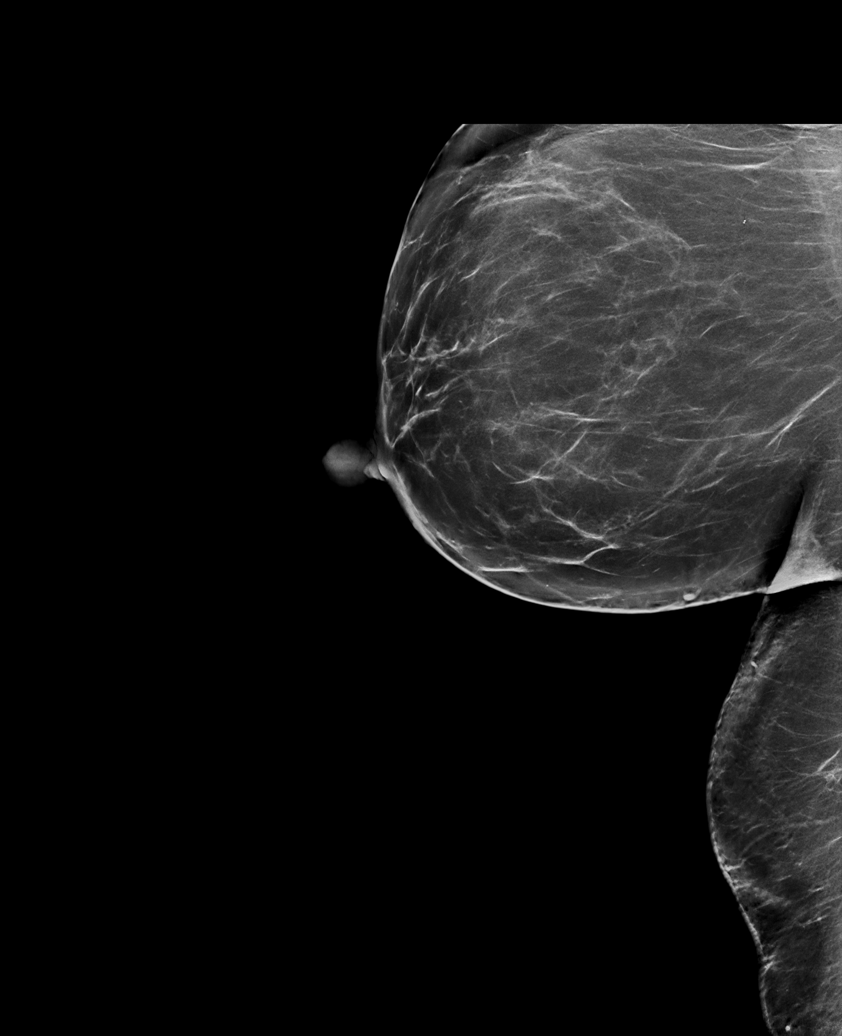

[L CC synth-2D]
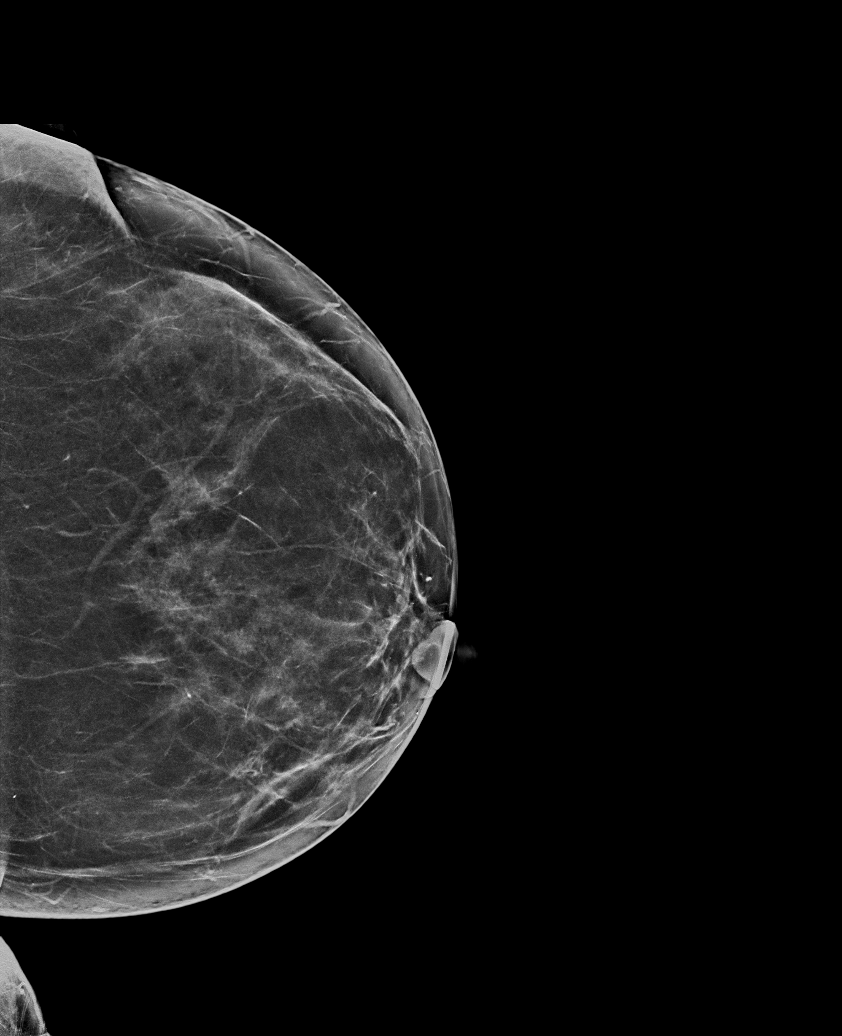

[R MLO synth-2D (2 of 2)]
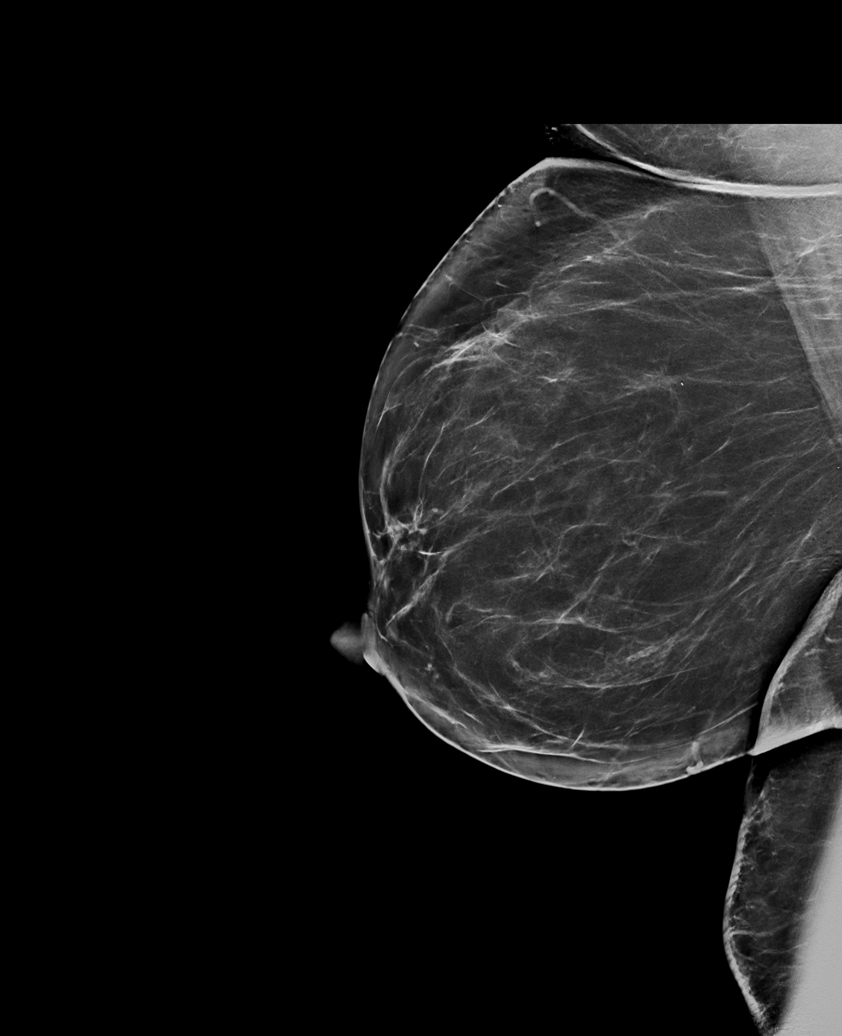

[L MLO synth-2D]
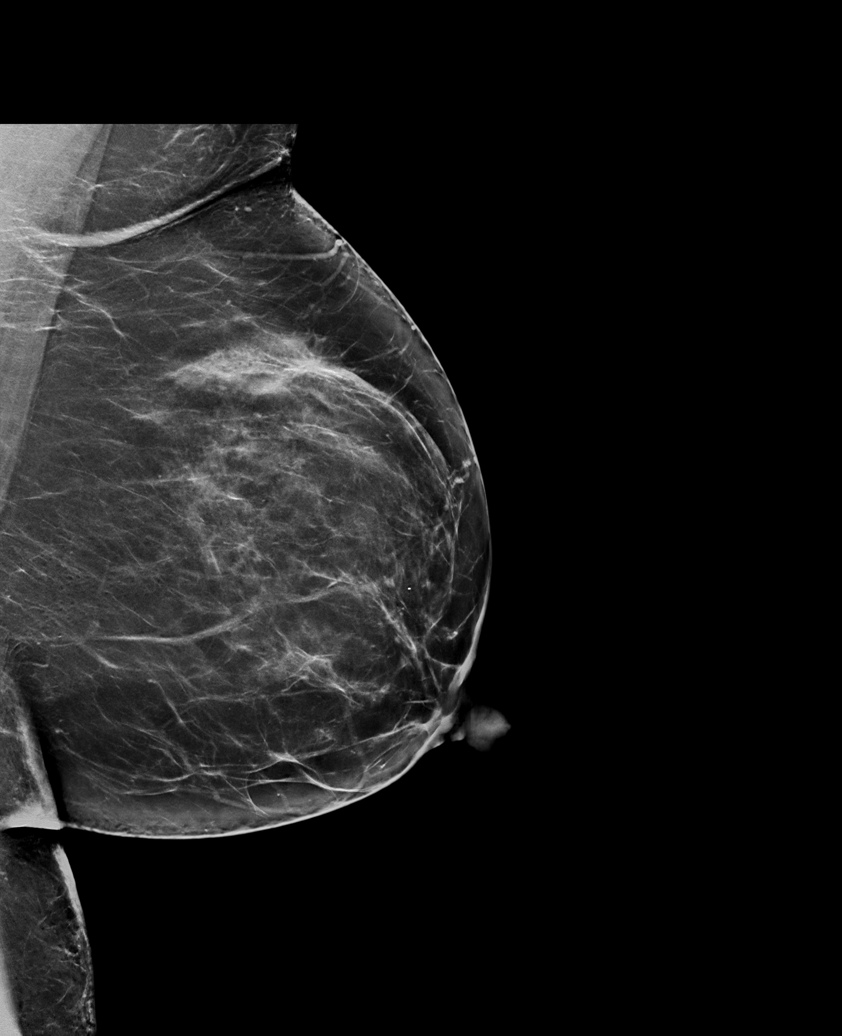

[R CC synth-2D]
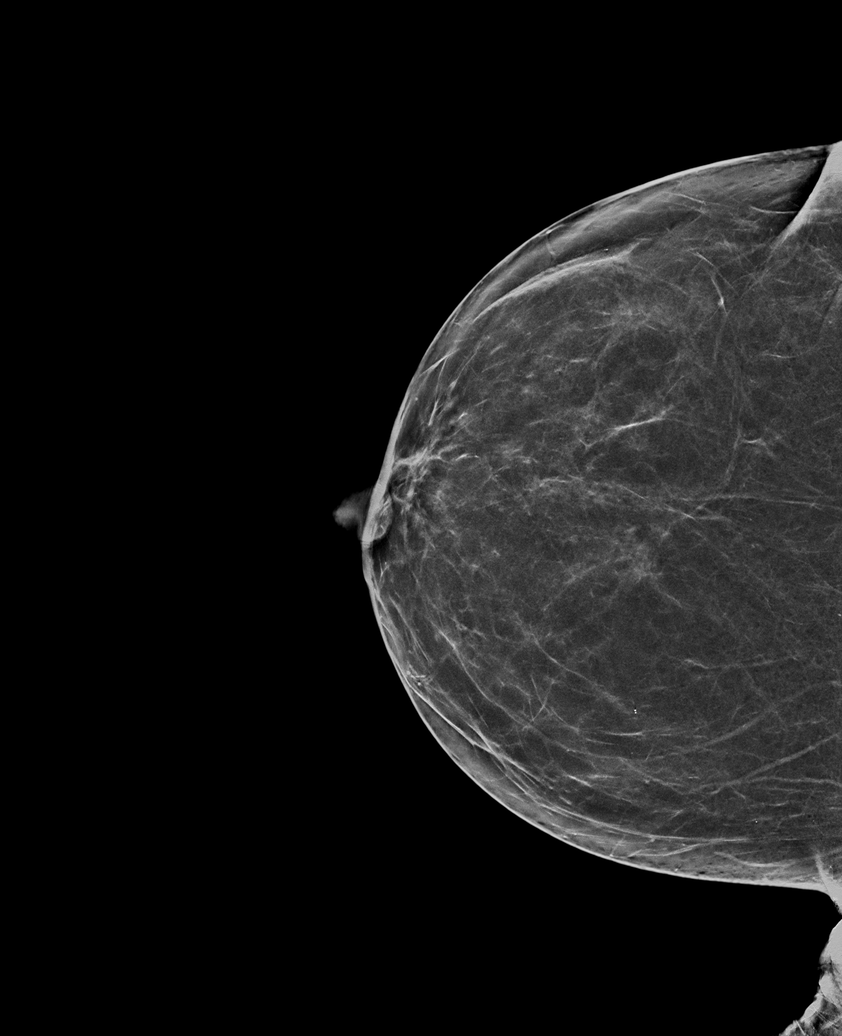

[R MLO tomo · tomo slice 40/79.0]
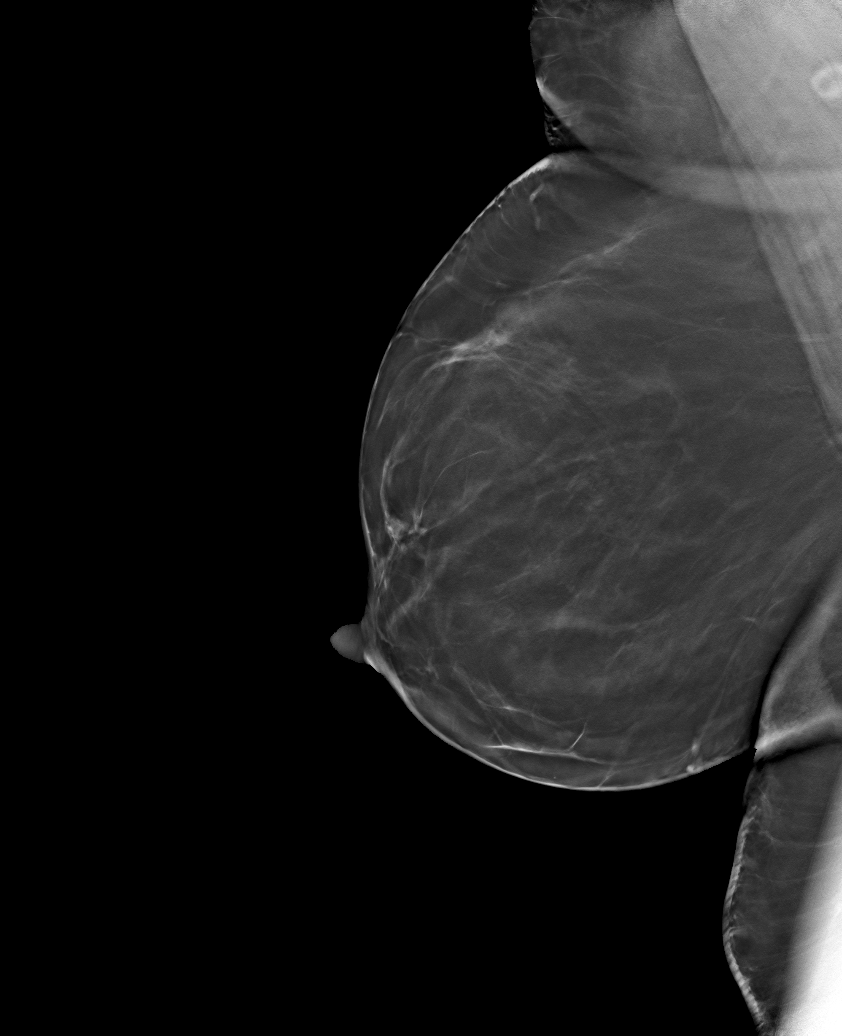

[6 of 30 positions shown; findings below may reference images not displayed]

ACR Breast Density Category b: There are scattered areas of
fibroglandular density.
FINDINGS: There are no findings suspicious for malignancy. Images were
processed with CAD.
IMPRESSION: No mammographic evidence of malignancy. A result letter of this
screening mammogram will be mailed directly to the patient.

RECOMMENDATION:
Screening mammogram in one year. (Code:[id number])

BI-RADS CATEGORY  1: Negative.

## 2021-02-10 ENCOUNTER — Telehealth: Payer: Self-pay | Admitting: Family Medicine

## 2021-02-10 ENCOUNTER — Other Ambulatory Visit: Payer: Self-pay | Admitting: Family Medicine

## 2021-02-10 MED ORDER — MOXIFLOXACIN HCL 0.5 % OP SOLN: 1.0000 [drp] | Freq: Three times a day (TID) | OPHTHALMIC | 0 refills | Status: AC

## 2021-02-10 NOTE — Telephone Encounter (Signed)
Patient  had a phone visit with Dr. Lorin Picket on 02/06/21 and came by for a strep test also and now she has pink eye and wanted to see if something could be called into [facility]

## 2021-02-10 NOTE — Telephone Encounter (Signed)
Patient notified

## 2021-02-16 ENCOUNTER — Other Ambulatory Visit: Payer: Self-pay | Admitting: Family Medicine

## 2021-03-05 ENCOUNTER — Other Ambulatory Visit (HOSPITAL_COMMUNITY): Payer: Self-pay | Admitting: Family Medicine

## 2021-03-05 DIAGNOSIS — Z1231 Encounter for screening mammogram for malignant neoplasm of breast: Secondary | ICD-10-CM

## 2021-03-06 ENCOUNTER — Telehealth (INDEPENDENT_AMBULATORY_CARE_PROVIDER_SITE_OTHER): Payer: Self-pay | Admitting: *Deleted

## 2021-03-06 ENCOUNTER — Encounter (INDEPENDENT_AMBULATORY_CARE_PROVIDER_SITE_OTHER): Payer: Self-pay | Admitting: *Deleted

## 2021-03-06 MED ORDER — PEG 3350-KCL-NA BICARB-NACL 420 G PO SOLR: 4000.0000 mL | Freq: Once | ORAL | 0 refills | Status: AC

## 2021-03-06 NOTE — Telephone Encounter (Signed)
Patient needs trilyte 

## 2021-03-09 ENCOUNTER — Other Ambulatory Visit (INDEPENDENT_AMBULATORY_CARE_PROVIDER_SITE_OTHER): Payer: Self-pay

## 2021-03-09 DIAGNOSIS — Z1211 Encounter for screening for malignant neoplasm of colon: Secondary | ICD-10-CM

## 2021-03-11 ENCOUNTER — Ambulatory Visit (HOSPITAL_COMMUNITY)
Admission: RE | Admit: 2021-03-11 | Discharge: 2021-03-11 | Disposition: A | Discharge status: Home / Self Care | Payer: BC Managed Care – PPO | Source: Ambulatory Visit | Attending: Family Medicine | Admitting: Family Medicine

## 2021-03-11 ENCOUNTER — Other Ambulatory Visit: Payer: Self-pay

## 2021-03-11 DIAGNOSIS — Z1231 Encounter for screening mammogram for malignant neoplasm of breast: Secondary | ICD-10-CM | POA: Insufficient documentation

## 2021-03-19 ENCOUNTER — Telehealth (INDEPENDENT_AMBULATORY_CARE_PROVIDER_SITE_OTHER): Payer: Self-pay | Admitting: *Deleted

## 2021-03-19 NOTE — Telephone Encounter (Signed)
Referring MD/PCP: cook  Procedure: tcs  Reason/Indication:  screening  Has patient had this procedure before?  no  If so, when, by whom and where?    Is there a family history of colon cancer?  no  Who?  What age when diagnosed?    Is patient diabetic? If yes, Type 1 or Type 2   no      Does patient have prosthetic heart valve or mechanical valve?  no  Do you have a pacemaker/defibrillator?  no  Has patient ever had endocarditis/atrial fibrillation? no  Does patient use oxygen? no  Has patient had joint replacement within last 12 months?  no  Is patient constipated or do they take laxatives? no  Does patient have a history of alcohol/drug use?  no  Have you had a stroke/heart attack last 6 mths? no  Do you take medicine for weight loss?  no  For female patients,: have you had a hysterectomy                       are you post menopausal                       do you still have your menstrual cycle no period since Sept 2022  Is patient on blood thinner such as Coumadin, Plavix and/or Aspirin? no  Medications: hctz 25 mg daily, potassium 10 mg daily, pravastatin 20 mg daily, fluoxetine 40 mg daily, amlodipine 10 mg daily, mvi daily, magnesium 400 mg daily, lysine 1000 mg daily  Allergies: biaxin, clindamycin  Medication Adjustment per Dr Rehman/Dr Levon Hedger   Procedure date & time: 04/15/21

## 2021-04-02 ENCOUNTER — Institutional Professional Consult (permissible substitution): Payer: BC Managed Care – PPO | Admitting: Neurology

## 2021-04-07 NOTE — Patient Instructions (Signed)
? ? ? ? ? ? Gabrielle Little ? 04/07/2021  ?  ? @PREFPERIOPPHARMACY @ ? ? Your procedure is scheduled on  04/15/2021. ? ? Report to 04/17/2021 at  0830  A.M. ? ? Call this number if you have problems the morning of surgery: ? [phone] ? ? Remember: ? Follow the diet and prep instructions given to you by the office. ?  ? Take these medicines the morning of surgery with A SIP OF WATER  ? ?                            amlodipine, prozac. ?  ? ? Do not wear jewelry, make-up or nail polish. ? Do not wear lotions, powders, or perfumes, or deodorant. ? Do not shave 48 hours prior to surgery.  Men may shave face and neck. ? Do not bring valuables to the hospital. ? [facility] is not responsible for any belongings or valuables. ? ?Contacts, dentures or bridgework may not be worn into surgery.  Leave your suitcase in the car.  After surgery it may be brought to your room. ? ?For patients admitted to the hospital, discharge time will be determined by your treatment team. ? ?Patients discharged the day of surgery will not be allowed to drive home and must have someone with them for 24 hours.  ? ? ?Special instructions:   DO NOT smoke tobacco or vape for 24 hours before your procedure. ? ?Please read over the following fact sheets that you were given. ?Anesthesia Post-op Instructions and Care and Recovery After Surgery ?  ? ? ? Colonoscopy, Adult, Care After ?This sheet gives you information about how to care for yourself after your procedure. Your health care provider may also give you more specific instructions. If you have problems or questions, contact your health care provider. ?What can I expect after the procedure? ?After the procedure, it is common to have: ?A small amount of blood in your stool for 24 hours after the procedure. ?Some gas. ?Mild cramping or bloating of your abdomen. ?Follow these instructions at home: ?Eating and drinking ? ?Drink enough fluid to keep your urine pale yellow. ?Follow instructions from your  health care provider about eating or drinking restrictions. ?Resume your normal diet as instructed by your health care provider. Avoid heavy or fried foods that are hard to digest. ?Activity ?Rest as told by your health care provider. ?Avoid sitting for a long time without moving. Get up to take short walks every 1-2 hours. This is important to improve blood flow and breathing. Ask for help if you feel weak or unsteady. ?Return to your normal activities as told by your health care provider. Ask your health care provider what activities are safe for you. ?Managing cramping and bloating ? ?Try walking around when you have cramps or feel bloated. ?Apply heat to your abdomen as told by your health care provider. Use the heat source that your health care provider recommends, such as a moist heat pack or a heating pad. ?Place a towel between your skin and the heat source. ?Leave the heat on for 20-30 minutes. ?Remove the heat if your skin turns bright red. This is especially important if you are unable to feel pain, heat, or cold. You may have a greater risk of getting burned. ?General instructions ?If you were given a sedative during the procedure, it can affect you for several hours. Do not drive or operate machinery  until your health care provider says that it is safe. ?For the first 24 hours after the procedure: ?Do not sign important documents. ?Do not drink alcohol. ?Do your regular daily activities at a slower pace than normal. ?Eat soft foods that are easy to digest. ?Take over-the-counter and prescription medicines only as told by your health care provider. ?Keep all follow-up visits as told by your health care provider. This is important. ?Contact a health care provider if: ?You have blood in your stool 2-3 days after the procedure. ?Get help right away if you have: ?More than a small spotting of blood in your stool. ?Large blood clots in your stool. ?Swelling of your abdomen. ?Nausea or vomiting. ?A  fever. ?Increasing pain in your abdomen that is not relieved with medicine. ?Summary ?After the procedure, it is common to have a small amount of blood in your stool. You may also have mild cramping and bloating of your abdomen. ?If you were given a sedative during the procedure, it can affect you for several hours. Do not drive or operate machinery until your health care provider says that it is safe. ?Get help right away if you have a lot of blood in your stool, nausea or vomiting, a fever, or increased pain in your abdomen. ?This information is not intended to replace advice given to you by your health care provider. Make sure you discuss any questions you have with your health care provider. ?Document Revised: 11/24/2018 Document Reviewed: 08/14/2018 ?Elsevier Patient Education ? [place]. ?Monitored Anesthesia Care, Care After ?This sheet gives you information about how to care for yourself after your procedure. Your health care provider may also give you more specific instructions. If you have problems or questions, contact your health care provider. ?What can I expect after the procedure? ?After the procedure, it is common to have: ?Tiredness. ?Forgetfulness about what happened after the procedure. ?Impaired judgment for important decisions. ?Nausea or vomiting. ?Some difficulty with balance. ?Follow these instructions at home: ?For the time period you were told by your health care provider: ?  ?Rest as needed. ?Do not participate in activities where you could fall or become injured. ?Do not drive or use machinery. ?Do not drink alcohol. ?Do not take sleeping pills or medicines that cause drowsiness. ?Do not make important decisions or sign legal documents. ?Do not take care of children on your own. ?Eating and drinking ?Follow the diet that is recommended by your health care provider. ?Drink enough fluid to keep your urine pale yellow. ?If you vomit: ?Drink water, juice, or soup when you can drink  without vomiting. ?Make sure you have little or no nausea before eating solid foods. ?General instructions ?Have a responsible adult stay with you for the time you are told. It is important to have someone help care for you until you are awake and alert. ?Take over-the-counter and prescription medicines only as told by your health care provider. ?If you have sleep apnea, surgery and certain medicines can increase your risk for breathing problems. Follow instructions from your health care provider about wearing your sleep device: ?Anytime you are sleeping, including during daytime naps. ?While taking prescription pain medicines, sleeping medicines, or medicines that make you drowsy. ?Avoid smoking. ?Keep all follow-up visits as told by your health care provider. This is important. ?Contact a health care provider if: ?You keep feeling nauseous or you keep vomiting. ?You feel light-headed. ?You are still sleepy or having trouble with balance after 24 hours. ?You develop a  rash. ?You have a fever. ?You have redness or swelling around the IV site. ?Get help right away if: ?You have trouble breathing. ?You have new-onset confusion at home. ?Summary ?For several hours after your procedure, you may feel tired. You may also be forgetful and have poor judgment. ?Have a responsible adult stay with you for the time you are told. It is important to have someone help care for you until you are awake and alert. ?Rest as told. Do not drive or operate machinery. Do not drink alcohol or take sleeping pills. ?Get help right away if you have trouble breathing, or if you suddenly become confused. ?This information is not intended to replace advice given to you by your health care provider. Make sure you discuss any questions you have with your health care provider. ?Document Revised: 10/04/2019 Document Reviewed: 12/21/2018 ?Elsevier Patient Education ? [place]. ? ?

## 2021-04-09 ENCOUNTER — Encounter (HOSPITAL_COMMUNITY): Payer: Self-pay | Admitting: *Deleted

## 2021-04-10 ENCOUNTER — Encounter (HOSPITAL_COMMUNITY)
Admission: RE | Admit: 2021-04-10 | Discharge: 2021-04-10 | Disposition: A | Discharge status: Home / Self Care | Payer: BC Managed Care – PPO | Source: Ambulatory Visit | Attending: Internal Medicine | Admitting: Internal Medicine

## 2021-04-13 ENCOUNTER — Other Ambulatory Visit (HOSPITAL_COMMUNITY)
Admission: RE | Admit: 2021-04-13 | Discharge: 2021-04-13 | Disposition: A | Discharge status: Home / Self Care | Payer: BC Managed Care – PPO | Source: Ambulatory Visit | Attending: Internal Medicine | Admitting: Internal Medicine

## 2021-04-13 DIAGNOSIS — Z1211 Encounter for screening for malignant neoplasm of colon: Secondary | ICD-10-CM | POA: Insufficient documentation

## 2021-04-13 DIAGNOSIS — F419 Anxiety disorder, unspecified: Secondary | ICD-10-CM | POA: Diagnosis not present

## 2021-04-13 DIAGNOSIS — K635 Polyp of colon: Secondary | ICD-10-CM | POA: Diagnosis not present

## 2021-04-13 DIAGNOSIS — I10 Essential (primary) hypertension: Secondary | ICD-10-CM | POA: Diagnosis not present

## 2021-04-13 LAB — BASIC METABOLIC PANEL
Anion gap: 8 (ref 5–15)
BUN: 12 mg/dL (ref 6–20)
CO2: 28 mmol/L (ref 22–32)
Calcium: 9.1 mg/dL (ref 8.9–10.3)
Chloride: 103 mmol/L (ref 98–111)
Creatinine, Ser: 0.76 mg/dL (ref 0.44–1.00)
GFR, Estimated: >60 mL/min (ref >60)
Glucose, Bld: 108 mg/dL — ABNORMAL HIGH (ref 70–99)
Potassium: 3.9 mmol/L (ref 3.5–5.1)
Sodium: 139 mmol/L (ref 135–145)

## 2021-04-13 LAB — PREGNANCY, URINE: Preg Test, Ur: NEGATIVE

## 2021-04-15 ENCOUNTER — Ambulatory Visit (HOSPITAL_COMMUNITY): Payer: BC Managed Care – PPO | Admitting: Anesthesiology

## 2021-04-15 ENCOUNTER — Encounter (HOSPITAL_COMMUNITY)
Admission: RE | Disposition: A | Discharge status: Home / Self Care | Payer: Self-pay | Source: Home / Self Care | Attending: Internal Medicine

## 2021-04-15 ENCOUNTER — Encounter (HOSPITAL_COMMUNITY): Payer: Self-pay | Admitting: Internal Medicine

## 2021-04-15 ENCOUNTER — Ambulatory Visit (HOSPITAL_COMMUNITY)
Admission: RE | Admit: 2021-04-15 | Discharge: 2021-04-15 | Disposition: A | Discharge status: Home / Self Care | Payer: BC Managed Care – PPO | Attending: Internal Medicine | Admitting: Internal Medicine

## 2021-04-15 DIAGNOSIS — I10 Essential (primary) hypertension: Secondary | ICD-10-CM | POA: Insufficient documentation

## 2021-04-15 DIAGNOSIS — K648 Other hemorrhoids: Secondary | ICD-10-CM | POA: Diagnosis not present

## 2021-04-15 DIAGNOSIS — F419 Anxiety disorder, unspecified: Secondary | ICD-10-CM | POA: Insufficient documentation

## 2021-04-15 DIAGNOSIS — Z1211 Encounter for screening for malignant neoplasm of colon: Secondary | ICD-10-CM

## 2021-04-15 DIAGNOSIS — K635 Polyp of colon: Secondary | ICD-10-CM

## 2021-04-15 HISTORY — PX: COLONOSCOPY WITH PROPOFOL: SHX5780

## 2021-04-15 HISTORY — PX: POLYPECTOMY: SHX5525

## 2021-04-15 LAB — HM COLONOSCOPY

## 2021-04-15 SURGERY — COLONOSCOPY WITH PROPOFOL
Anesthesia: General

## 2021-04-15 MED ORDER — LACTATED RINGERS IV SOLN: INTRAVENOUS | Status: DC | PRN

## 2021-04-15 MED ORDER — PROPOFOL 10 MG/ML IV BOLUS
INTRAVENOUS | Status: DC | PRN
  Administered 2021-04-15: 100 mg via INTRAVENOUS

## 2021-04-15 MED ORDER — PROPOFOL 500 MG/50ML IV EMUL
INTRAVENOUS | Status: DC | PRN
Start: 2021-04-15 — End: 2021-04-15
  Administered 2021-04-15: 150 ug/kg/min via INTRAVENOUS

## 2021-04-15 MED ORDER — LIDOCAINE HCL 1 % IJ SOLN
INTRAMUSCULAR | Status: DC | PRN
  Administered 2021-04-15: 50 mg via INTRADERMAL

## 2021-04-15 NOTE — Op Note (Signed)
[facility] ?Patient Name: Gabrielle Little ?Procedure Date: 04/15/2021 9:44 AM ?MRN: [id number] ?Date of Birth: Aug 26, 1965 ?Attending MD: Lionel December , MD ?CSN: [id number] ?Age: 56 ?Admit Type: Outpatient ?Procedure:                Colonoscopy ?Indications:              Screening for colorectal malignant neoplasm ?Providers:                Lionel December, MD, Sheran Fava, Dyann Ruddle ?Referring MD:             Everlene Other, DO ?Medicines:                Propofol per Anesthesia ?Complications:            No immediate complications. ?Estimated Blood Loss:     Estimated blood loss was minimal. ?Procedure:                Pre-Anesthesia Assessment: ?                          - Prior to the procedure, a History and Physical  ?                          was performed, and patient medications and  ?                          allergies were reviewed. The patient's tolerance of  ?                          previous anesthesia was also reviewed. The risks  ?                          and benefits of the procedure and the sedation  ?                          options and risks were discussed with the patient.  ?                          All questions were answered, and informed consent  ?                          was obtained. Prior Anticoagulants: The patient has  ?                          taken no previous anticoagulant or antiplatelet  ?                          agents. ASA Grade Assessment: II - A patient with  ?                          mild systemic disease. After reviewing the risks  ?                          and benefits, the patient was deemed in  ?  satisfactory condition to undergo the procedure. ?                          After obtaining informed consent, the colonoscope  ?                          was passed under direct vision. Throughout the  ?                          procedure, the patient's blood pressure, pulse, and  ?                          oxygen saturations were monitored  continuously. The  ?                          PCF-HQ190L ([id number]) scope was introduced through  ?                          the anus and advanced to the the cecum, identified  ?                          by appendiceal orifice and ileocecal valve. The  ?                          colonoscopy was performed without difficulty. The  ?                          patient tolerated the procedure well. The quality  ?                          of the bowel preparation was good. The ileocecal  ?                          valve, appendiceal orifice, and rectum were  ?                          photographed. ?Scope In: 10:08:24 AM ?Scope Out: 10:23:35 AM ?Scope Withdrawal Time: 0 hours 9 minutes 25 seconds  ?Total Procedure Duration: 0 hours 15 minutes 11 seconds  ?Findings: ?     The perianal and digital rectal examinations were normal. ?     A small polyp was found in the cecum. The polyp was flat. The polyp was  ?     removed with a cold snare. Resection and retrieval were complete. The  ?     pathology specimen was placed into Bottle Number 1. ?     The exam was otherwise normal throughout the examined colon. ?     The retroflexed view of the distal rectum and anal verge was normal and  ?     showed no anal or rectal abnormalities. ?Impression:               - One small polyp in the cecum, removed with a cold  ?                          snare. Resected and retrieved. ?  Moderate Sedation: ?     Per Anesthesia Care ?Recommendation:           - Patient has a contact number available for  ?                          emergencies. The signs and symptoms of potential  ?                          delayed complications were discussed with the  ?                          patient. Return to normal activities tomorrow.  ?                          Written discharge instructions were provided to the  ?                          patient. ?                          - Resume previous diet today. ?                          - Continue present  medications. ?                          - No aspirin, ibuprofen, naproxen, or other  ?                          non-steroidal anti-inflammatory drugs for 1 day. ?                          - Await pathology results. ?                          - Repeat colonoscopy is recommended. The  ?                          colonoscopy date will be determined after pathology  ?                          results from today's exam become available for  ?                          review. ?Procedure Code(s):        --- Professional --- ?                          [phone], Colonoscopy, flexible; with removal of  ?                          tumor(s), polyp(s), or other lesion(s) by snare  ?                          technique ?Diagnosis Code(s):        --- Professional --- ?  Z12.11, Encounter for screening for malignant  ?                          neoplasm of colon ?                          K63.5, Polyp of colon ?CPT copyright 2019 [employer]. All rights reserved. ?The codes documented in this report are preliminary and upon coder review may  ?be revised to meet current compliance requirements. ?Lionel DecemberNajeeb Jozsef Wescoat, MD ?Lionel DecemberNajeeb Marrion Accomando, MD ?04/15/2021 10:29:37 AM ?This report has been signed electronically. ?Number of Addenda: 0 ?

## 2021-04-15 NOTE — Discharge Instructions (Signed)
No aspirin or NSAIDs for 24 hours Resume usual medications and diet as before. No driving for 24 hours. Physician will call with biopsy results. 

## 2021-04-15 NOTE — Anesthesia Preprocedure Evaluation (Signed)
Anesthesia Evaluation  ?Patient identified by MRN, date of birth, ID band ?Patient awake ? ? ? ?Reviewed: ?Allergy & Precautions, H&P , NPO status , Patient's Chart, lab work & pertinent test results, reviewed documented beta blocker date and time  ? ?Airway ?Mallampati: II ? ?TM Distance: >3 FB ?Neck ROM: full ? ? ? Dental ?no notable dental hx. ? ?  ?Pulmonary ?sleep apnea ,  ?  ?Pulmonary exam normal ?breath sounds clear to auscultation ? ? ? ? ? ? Cardiovascular ?Exercise Tolerance: Good ?hypertension, negative cardio ROS ? ? ?Rhythm:regular Rate:Normal ? ? ?  ?Neuro/Psych ?PSYCHIATRIC DISORDERS Anxiety negative neurological ROS ?   ? GI/Hepatic ?negative GI ROS, Neg liver ROS,   ?Endo/Other  ?negative endocrine ROS ? Renal/GU ?negative Renal ROS  ?negative genitourinary ?  ?Musculoskeletal ? ? Abdominal ?  ?Peds ? Hematology ?negative hematology ROS ?(+)   ?Anesthesia Other Findings ? ? Reproductive/Obstetrics ?negative OB ROS ? ?  ? ? ? ? ? ? ? ? ? ? ? ? ? ?  ?  ? ? ? ? ? ? ? ? ?Anesthesia Physical ?Anesthesia Plan ? ?ASA: 2 ? ?Anesthesia Plan: General  ? ?Post-op Pain Management:   ? ?Induction:  ? ?PONV Risk Score and Plan: Propofol infusion ? ?Airway Management Planned:  ? ?Additional Equipment:  ? ?Intra-op Plan:  ? ?Post-operative Plan:  ? ?Informed Consent: I have reviewed the patients History and Physical, chart, labs and discussed the procedure including the risks, benefits and alternatives for the proposed anesthesia with the patient or authorized representative who has indicated his/her understanding and acceptance.  ? ? ? ?Dental Advisory Given ? ?Plan Discussed with: CRNA ? ?Anesthesia Plan Comments:   ? ? ? ? ? ? ?Anesthesia Quick Evaluation ? ?

## 2021-04-15 NOTE — Anesthesia Postprocedure Evaluation (Signed)
Anesthesia Post Note ? ?Patient: Gabrielle Little ? ?Procedure(s) Performed: COLONOSCOPY WITH PROPOFOL ?POLYPECTOMY ? ?Patient location during evaluation: Short Stay ?Anesthesia Type: General ?Level of consciousness: awake and alert ?Pain management: pain level controlled ?Vital Signs Assessment: post-procedure vital signs reviewed and stable ?Respiratory status: spontaneous breathing ?Cardiovascular status: blood pressure returned to baseline and stable ?Postop Assessment: no apparent nausea or vomiting ?Anesthetic complications: no ? ? ?No notable events documented. ? ? ?Last Vitals:  ?Vitals:  ? 04/15/21 0857  ?BP: 131/81  ?Resp: 14  ?Temp: 36.9 ?C  ?SpO2: 97%  ?  ?Last Pain:  ?Vitals:  ? 04/15/21 1007  ?TempSrc:   ?PainSc: 0-No pain  ? ? ?  ?  ?  ?  ?  ?  ? ?Arles Rumbold ? ? ? ? ?

## 2021-04-15 NOTE — Transfer of Care (Signed)
Immediate Anesthesia Transfer of Care Note ? ?Patient: Gabrielle Little ? ?Procedure(s) Performed: COLONOSCOPY WITH PROPOFOL ?POLYPECTOMY ? ?Patient Location: Short Stay ? ?Anesthesia Type:General ? ?Level of Consciousness: awake ? ?Airway & Oxygen Therapy: Patient Spontanous Breathing ? ?Post-op Assessment: Report given to RN ? ?Post vital signs: Reviewed ? ?Last Vitals:  ?Vitals Value Taken Time  ?BP    ?Temp    ?Pulse    ?Resp    ?SpO2    ? ? ?Last Pain:  ?Vitals:  ? 04/15/21 1007  ?TempSrc:   ?PainSc: 0-No pain  ?   ? ?Patients Stated Pain Goal: 8 (04/15/21 0857) ? ?Complications: No notable events documented. ?

## 2021-04-15 NOTE — H&P (Signed)
Gabrielle Little is an 56 y.o. female.   ?Chief Complaint: Patient is here for colonoscopy ?HPI: Patient is 56 year old Caucasian female who is here for screening colonoscopy.  She denies abdominal pain change in bowel habits or rectal bleeding.  This is her first exam. ?Family history is negative for colon cancer. ?Patient does not take aspirin or anticoagulants. ? ?Past Medical History:  ?Diagnosis Date  ? Allergic rhinitis   ? Anxiety   ? Hyperlipidemia   ? Hypertension   ? ? ?Past Surgical History:  ?Procedure Laterality Date  ? CESAREAN SECTION    ? DILATION AND CURETTAGE OF UTERUS    ? ? ?Family History  ?Problem Relation Age of Onset  ? Hypertension Mother   ? Osteoporosis Mother   ? Heart attack Father 70  ?     first MI  ? Hypertension Father   ? Diabetes Father   ? Cancer Maternal Aunt   ?     breast  ? Hypertension Maternal Grandfather   ? Diabetes Paternal Grandmother   ? ?Social History:  reports that she has never smoked. She has never used smokeless tobacco. She reports that she does not drink alcohol and does not use drugs. ? ?Allergies:  ?Allergies  ?Allergen Reactions  ? Biaxin [Clarithromycin] Rash  ? Clindamycin/Lincomycin Rash  ?  Patient had angioedema extensive rash 2 days after stopping clindamycin-probable allergy  ? ? ?Medications Prior to Admission  ?Medication Sig Dispense Refill  ? amLODipine (NORVASC) 10 MG tablet TAKE ONE (1) TABLET BY MOUTH EVERY DAY (Patient taking differently: Take 10 mg by mouth at bedtime.) 90 tablet 1  ? FLUoxetine (PROZAC) 40 MG capsule TAKE ONE CAPSULE (40MG  TOTAL) BY MOUTH DAILY 90 capsule 1  ? hydrochlorothiazide (HYDRODIURIL) 25 MG tablet TAKE ONE TABLET (25MG  TOTAL) BY MOUTH DAILY (Patient taking differently: Take 25 mg by mouth every other day.) 90 tablet 1  ? Lysine 500 MG TABS Take 500 mg by mouth daily.    ? magnesium oxide (MAG-OX) 400 MG tablet Take 400 mg by mouth every other day.    ? Multiple Vitamin (MULTIVITAMIN WITH MINERALS) TABS tablet Take 1  tablet by mouth daily.    ? potassium chloride (KLOR-CON 10) 10 MEQ tablet Take 1 tablet (10 mEq total) by mouth daily. 90 tablet 1  ? pravastatin (PRAVACHOL) 20 MG tablet TAKE ONE BY MOUTH AFTER EVENING MEAL (Patient taking differently: Take 20 mg by mouth at bedtime.) 90 tablet 1  ? ? ?Results for orders placed or performed during the hospital encounter of 04/13/21 (from the past 48 hour(s))  ?Basic metabolic panel     Status: Abnormal  ? Collection Time: 04/13/21 10:50 AM  ?Result Value Ref Range  ? Sodium 139 135 - 145 mmol/L  ? Potassium 3.9 3.5 - 5.1 mmol/L  ? Chloride 103 98 - 111 mmol/L  ? CO2 28 22 - 32 mmol/L  ? Glucose, Bld 108 (H) 70 - 99 mg/dL  ?  Comment: Glucose reference range applies only to samples taken after fasting for at least 8 hours.  ? BUN 12 6 - 20 mg/dL  ? Creatinine, Ser 0.76 0.44 - 1.00 mg/dL  ? Calcium 9.1 8.9 - 10.3 mg/dL  ? GFR, Estimated >60 >60 mL/min  ?  Comment: (NOTE) ?Calculated using the CKD-EPI Creatinine Equation (2021) ?  ? Anion gap 8 5 - 15  ?  Comment: Performed at [facility], [address] [place], [address] [place]  ?Pregnancy, urine  Status: None  ? Collection Time: 04/13/21 10:50 AM  ?Result Value Ref Range  ? Preg Test, Ur NEGATIVE NEGATIVE  ?  Comment:        ?THE SENSITIVITY OF THIS ?METHODOLOGY IS >20 mIU/mL. ?Performed at [facility], [address], [place], [place] [address] ?  ? ?No results found. ? ?Review of Systems ? ?Blood pressure 131/81, temperature 98.5 ?F (36.9 ?C), temperature source Oral, resp. rate 14, height 5\' 1"  (1.549 m), weight 83.9 kg, SpO2 97 %. ?Physical Exam ?HENT:  ?   Mouth/Throat:  ?   Mouth: Mucous membranes are moist.  ?   Pharynx: Oropharynx is clear.  ?Eyes:  ?   General: No scleral icterus. ?   Conjunctiva/sclera: Conjunctivae normal.  ?Cardiovascular:  ?   Rate and Rhythm: Normal rate and regular rhythm.  ?   Heart sounds: Normal heart sounds. No murmur heard. ?Pulmonary:  ?   Effort: Pulmonary effort is normal.  ?   Breath  sounds: Normal breath sounds.  ?Abdominal:  ?   General: There is no distension.  ?   Palpations: Abdomen is soft. There is no mass.  ?   Tenderness: There is no abdominal tenderness.  ?   Comments: Pfannenstiel scar  ?Musculoskeletal:     ?   General: No swelling.  ?   Cervical back: Neck supple.  ?Lymphadenopathy:  ?   Cervical: No cervical adenopathy.  ?Skin: ?   General: Skin is warm and dry.  ?Neurological:  ?   Mental Status: She is alert.  ?  ? ?Assessment/Plan ? ?Average risk screening colonoscopy ? ? ? , MD ?04/15/2021, 10:02 AM ? ? ? ?

## 2021-04-16 ENCOUNTER — Encounter (INDEPENDENT_AMBULATORY_CARE_PROVIDER_SITE_OTHER): Payer: Self-pay | Admitting: *Deleted

## 2021-04-16 LAB — SURGICAL PATHOLOGY

## 2021-04-20 ENCOUNTER — Encounter (HOSPITAL_COMMUNITY): Payer: Self-pay | Admitting: Internal Medicine

## 2021-05-26 ENCOUNTER — Institutional Professional Consult (permissible substitution): Payer: BC Managed Care – PPO | Admitting: Neurology

## 2021-06-03 ENCOUNTER — Encounter: Payer: Self-pay | Admitting: Neurology

## 2021-06-03 ENCOUNTER — Ambulatory Visit: Payer: BC Managed Care – PPO | Admitting: Neurology

## 2021-06-03 VITALS — BP 131/78 | HR 70 | Ht 61.0 in | Wt 186.4 lb

## 2021-06-03 DIAGNOSIS — G4719 Other hypersomnia: Secondary | ICD-10-CM

## 2021-06-03 DIAGNOSIS — R351 Nocturia: Secondary | ICD-10-CM

## 2021-06-03 DIAGNOSIS — G4733 Obstructive sleep apnea (adult) (pediatric): Secondary | ICD-10-CM

## 2021-06-03 DIAGNOSIS — Z820 Family history of epilepsy and other diseases of the nervous system: Secondary | ICD-10-CM

## 2021-06-03 DIAGNOSIS — E669 Obesity, unspecified: Secondary | ICD-10-CM | POA: Diagnosis not present

## 2021-06-03 DIAGNOSIS — R609 Edema, unspecified: Secondary | ICD-10-CM

## 2021-06-03 DIAGNOSIS — R635 Abnormal weight gain: Secondary | ICD-10-CM

## 2021-06-03 NOTE — Progress Notes (Addendum)
Subjective:    Patient ID: Gabrielle Little is a 56 y.o. female.  HPI    Huston FoleySaima Deshay Kirstein, MD, PhD [facility] [address], [address] [address] [place], [place] [address]  Dear Dr. Adriana Simasook,   I saw your patient, Gabrielle ReamsWanda Little, upon your kind request in my sleep clinic today for initial consultation, evaluation of her prior diagnosis of obstructive sleep apnea.  The patient is accompanied by her husband today.  As you know, Ms. Gabrielle Little is a 56 year old right-handed woman with an underlying medical history of hypertension, hyperlipidemia, anxiety, allergic rhinitis, and obesity, who was previously diagnosed with obstructive sleep apnea and placed on PAP therapy.  I reviewed your office note from 01/13/2021.  Prior sleep study results were reviewed today.  She had a split-night sleep study on 08/06/2012, study was conducted at [facility] and study was interpreted by Dr. Beryle BeamsKofi Doonquah.  She had a baseline AHI of 51/h, O2 nadir 78% during REM sleep.  She was titrated on CPAP between 5 and 15 cm with an optimal pressure deemed at 13 cm.  She reports that she never actually got her own machine, she was using her father's machine as he was not using his machine at the time.  It was an older model and it was cumbersome to use.  She was not consistent with it and has not used CPAP in about 4 years by her estimate.  A compliance report is not available as she has not used CPAP in years.  She has an Epworth sleepiness score of 11 out of 24, fatigue severity score is 17 out of 63.  Per husband, she snores loudly and she has witnessed apneas per sister's observation.  Her bedtime is around 10 PM and rise time around 4:45 AM.  She works part-time as an Wellsite geologistart teacher, about 3 hours a day and part-time at [employer].  She has caffeine in the form of tea, about 2 servings per day, no daily coffee, occasional soda.  She does not drink any alcohol.  She is a non-smoker.  They have 3 grown children, 1 dog in  the household.  She does have a TV on in her bedroom at night and her husband puts it on a timer.  She denies recurrent morning headaches.  She has nocturia about 3 times per average night.  She has slowly gained weight over the past 8 years in the realm of 20 pounds.  Her father had sleep apnea and her brother has sleep apnea and is on a CPAP machine.  Her Past Medical History Is Significant For: Past Medical History:  Diagnosis Date   Allergic rhinitis    Anxiety    Hyperlipidemia    Hypertension     Her Past Surgical History Is Significant For: Past Surgical History:  Procedure Laterality Date   CESAREAN SECTION     COLONOSCOPY WITH PROPOFOL N/A 04/15/2021   Procedure: COLONOSCOPY WITH PROPOFOL;  Surgeon: Malissa Hippoehman, Najeeb U, MD;  [facility];  Service: Endoscopy;  Laterality: N/A;  10:00 - ASA I/II   DILATION AND CURETTAGE OF UTERUS     POLYPECTOMY  04/15/2021   Procedure: POLYPECTOMY;  Surgeon: Malissa Hippoehman, Najeeb U, MD;  Location: [facility];  Service: Endoscopy;;    Her Family History Is Significant For: Family History  Problem Relation Age of Onset   Hypertension Mother    Osteoporosis Mother    Heart attack Father [id number]  first MI   Hypertension Father    Diabetes Father    Sleep apnea Father    Sleep apnea Sister    Sleep apnea Brother    Cancer Maternal Aunt        breast   Hypertension Maternal Grandfather    Diabetes Paternal Grandmother     Her Social History Is Significant For: Social History   Socioeconomic History   Marital status: Married    Spouse name: Not on file   Number of children: Not on file   Years of education: Not on file   Highest education level: Not on file  Occupational History   Not on file  Tobacco Use   Smoking status: Never   Smokeless tobacco: Never  Vaping Use   Vaping Use: Never used  Substance and Sexual Activity   Alcohol use: No   Drug use: No   Sexual activity: Yes    Birth control/protection: Surgical     Comment: tubal  Other Topics Concern   Not on file  Social History Narrative   Not on file   Social Determinants of Health   Financial Resource Strain: Not on file  Food Insecurity: Not on file  Transportation Needs: Not on file  Physical Activity: Not on file  Stress: Not on file  Social Connections: Not on file    Her Allergies Are:  Allergies  Allergen Reactions   Biaxin [Clarithromycin] Rash   Clindamycin/Lincomycin Rash    Patient had angioedema extensive rash 2 days after stopping clindamycin-probable allergy  :   Her Current Medications Are:  Outpatient Encounter Medications as of 06/03/2021  Medication Sig   amLODipine (NORVASC) 10 MG tablet TAKE ONE (1) TABLET BY MOUTH EVERY DAY (Patient taking differently: Take 10 mg by mouth at bedtime.)   FLUoxetine (PROZAC) 40 MG capsule TAKE ONE CAPSULE (40MG  TOTAL) BY MOUTH DAILY   hydrochlorothiazide (HYDRODIURIL) 25 MG tablet TAKE ONE TABLET (25MG  TOTAL) BY MOUTH DAILY (Patient taking differently: Take 25 mg by mouth every other day.)   Lysine 500 MG TABS Take 500 mg by mouth daily.   Multiple Vitamin (MULTIVITAMIN WITH MINERALS) TABS tablet Take 1 tablet by mouth daily.   potassium chloride (KLOR-CON 10) 10 MEQ tablet Take 1 tablet (10 mEq total) by mouth daily.   pravastatin (PRAVACHOL) 20 MG tablet TAKE ONE BY MOUTH AFTER EVENING MEAL (Patient taking differently: Take 20 mg by mouth at bedtime.)   magnesium oxide (MAG-OX) 400 MG tablet Take 400 mg by mouth every other day.   No facility-administered encounter medications on file as of 06/03/2021.  :   Review of Systems:  Out of a complete 14 point review of systems, all are reviewed and negative with the exception of these symptoms as listed below:   Review of Systems  Neurological:        Pt is here sleep consult  Pt states snores,fatigue hypertension. Pt states had sleep study 5 years ago currently no CPAP machine pt states she used her dads CPAP machine but had a hard  time keeping mask on face and machine was loud   FSS:11 ESS:17   Objective:  Neurological Exam  Physical Exam Physical Examination:   Vitals:   06/03/21 1246  BP: 131/78  Pulse: 70    General Examination: The patient is a very pleasant 56 y.o. female in no acute distress. She appears well-developed and well-nourished and well groomed.   HEENT: Normocephalic, atraumatic, pupils are equal, round and reactive to light,  extraocular tracking is good without limitation to gaze excursion or nystagmus noted. Hearing is grossly intact. Face is symmetric with normal facial animation. Speech is clear with no dysarthria noted. There is no hypophonia. There is no lip, neck/head, jaw or voice tremor. Neck is supple with full range of passive and active motion. There are no carotid bruits on auscultation. Oropharynx exam reveals: mild mouth dryness, adequate dental hygiene and moderate airway crowding, due to small airway entry, larger uvula, tonsils not fully visualized, Mallampati class III, neck circumference of 15-1/4 inches.  She has a mild overbite.  Evidence of teeth grinding.   Chest: Clear to auscultation without wheezing, rhonchi or crackles noted.  Heart: S1+S2+0, regular and normal without murmurs, rubs or gallops noted.   Abdomen: Soft, non-tender and non-distended.  Extremities: There is trace pitting edema in the distal lower extremities bilaterally.   Skin: Warm and dry without trophic changes noted.   Musculoskeletal: exam reveals no obvious joint deformities.   Neurologically:  Mental status: The patient is awake, alert and oriented in all 4 spheres. Her immediate and remote memory, attention, language skills and fund of knowledge are appropriate. There is no evidence of aphasia, agnosia, apraxia or anomia. Speech is clear with normal prosody and enunciation. Thought process is linear. Mood is normal and affect is normal.  Cranial nerves II - XII are as described above under  HEENT exam.  Motor exam: Normal bulk, strength and tone is noted. There is no obvious tremor. Fine motor skills and coordination: grossly intact.  Cerebellar testing: No dysmetria or intention tremor. There is no truncal or gait ataxia.  Sensory exam: intact to light touch in the upper and lower extremities.  Gait, station and balance: She stands easily. No veering to one side is noted. No leaning to one side is noted. Posture is age-appropriate and stance is narrow based. Gait shows normal stride length and normal pace. No problems turning are noted.   Assessment and Plan:  In summary, DAWNELL BRYANT is a very pleasant 56 y.o.-year old female with an underlying medical history of hypertension, hyperlipidemia, anxiety, allergic rhinitis, and obesity, who presents for evaluation of her prior diagnosis of obstructive sleep apnea.  She no longer has a CPAP machine.  She is advised to proceed with reevaluation with testing.  I had a long chat with the patient and her husband about my findings and the diagnosis of OSA, its prognosis and treatment options. We talked about medical treatments, surgical interventions and non-pharmacological approaches. I explained in particular the risks and ramifications of untreated moderate to severe OSA, especially with respect to developing cardiovascular disease down the Road, including congestive heart failure, difficult to treat hypertension, cardiac arrhythmias, or stroke. Even type 2 diabetes has, in part, been linked to untreated OSA. Symptoms of untreated OSA include daytime sleepiness, memory problems, mood irritability and mood disorder such as depression and anxiety, lack of energy, as well as recurrent headaches, especially morning headaches. We talked about trying to maintain a healthy lifestyle in general, as well as the importance of weight control. We also talked about the importance of good sleep hygiene. I recommended the following at this time: sleep study.  I  outlined the differences between a laboratory attended sleep study versus home sleep testing. I explained the sleep test procedure to the patient and also outlined possible surgical and non-surgical treatment options of OSA, including the use of a custom-made dental device (which would require a referral to a specialist dentist  or oral surgeon), upper airway surgical options, such as traditional UPPP or a novel less invasive surgical option in the form of Inspire hypoglossal nerve stimulation (which would involve a referral to an ENT surgeon). I also explained the CPAP treatment option to the patient, who indicated that she would be willing to try PAP therapy, if the need arises. I explained the importance of being compliant with PAP treatment, not only for insurance purposes but primarily to improve Her symptoms, and for the patient's long term health benefit, including to reduce Her cardiovascular risks. I answered all their questions today and the patient and her husband were in agreement. I plan to see her back after the sleep study is completed and encouraged her to call with any interim questions, concerns, problems or updates.   Thank you very much for allowing me to participate in the care of this nice patient. If I can be of any further assistance to you please do not hesitate to call me at [phone].  Sincerely,   Huston Foley, MD, PhD

## 2021-06-03 NOTE — Patient Instructions (Signed)

## 2021-06-09 ENCOUNTER — Telehealth: Payer: Self-pay

## 2021-06-09 NOTE — Telephone Encounter (Signed)
LVM for pt to call me back to schedule sleep study  

## 2021-06-22 ENCOUNTER — Ambulatory Visit: Payer: BC Managed Care – PPO | Admitting: Neurology

## 2021-06-22 DIAGNOSIS — E669 Obesity, unspecified: Secondary | ICD-10-CM

## 2021-06-22 DIAGNOSIS — G4734 Idiopathic sleep related nonobstructive alveolar hypoventilation: Secondary | ICD-10-CM

## 2021-06-22 DIAGNOSIS — G4733 Obstructive sleep apnea (adult) (pediatric): Secondary | ICD-10-CM

## 2021-06-22 DIAGNOSIS — R351 Nocturia: Secondary | ICD-10-CM

## 2021-06-22 DIAGNOSIS — Z820 Family history of epilepsy and other diseases of the nervous system: Secondary | ICD-10-CM

## 2021-06-22 DIAGNOSIS — R609 Edema, unspecified: Secondary | ICD-10-CM

## 2021-06-22 DIAGNOSIS — R635 Abnormal weight gain: Secondary | ICD-10-CM

## 2021-06-22 DIAGNOSIS — G4719 Other hypersomnia: Secondary | ICD-10-CM

## 2021-06-24 NOTE — Procedures (Signed)
[facility]  HOME SLEEP TEST (Watch PAT) REPORT  STUDY DATE: 06/22/2021  DOB: 1965/04/03  MRN: [id number]  ORDERING CLINICIAN: Huston Foley, MD, PhD   REFERRING CLINICIAN: Tommie Sams, DO   CLINICAL INFORMATION/HISTORY: 56 year old right-handed woman with an underlying medical history of hypertension, hyperlipidemia, anxiety, allergic rhinitis, and obesity, who was previously diagnosed with severe obstructive sleep apnea.  She is no longer on CPAP therapy.  Epworth sleepiness score: 11/24.  BMI: 35 kg/m  FINDINGS:   Sleep Summary:   Total Recording Time (hours, min): 8 hours, 35 minutes  Total Sleep Time (hours, min):  7 hours, 35 minutes   Percent REM (%):    5.4%   Respiratory Indices:   Calculated pAHI (per hour):  78.4/hour         REM pAHI:    n/a       Oxygen Saturation Statistics:    Oxygen Saturation (%) Mean: 94%   Minimum oxygen saturation (%):                 80%   O2 Saturation Range (%): 80-99%    O2 Saturation (minutes) <=88%: 39.6 min  Pulse Rate Statistics:   Pulse Mean (bpm):    70/min    Pulse Range (50-104/min)   IMPRESSION: OSA (obstructive sleep apnea), severe Nocturnal hypoxemia  RECOMMENDATION:  This home sleep test demonstrates severe obstructive sleep apnea with a total AHI of 70.4/hour and O2 nadir of 80% with significant time below or at 88% saturation of nearly over 30 minutes for the night.  Snoring was mostly in the moderate range. Treatment with positive airway pressure is highly recommended.  I will request an urgent set up on AutoPap therapy.  A laboratory attended titration study can be considered in the future for optimization of her treatment and better tolerance of therapy.  Alternative treatment options are limited secondary to the severity of the patient's sleep disordered breathing but may include inspire, hypoglossal nerve stimulator placement. Concomitant weight loss is recommended and for now the  patient will be asked to sleep on her sides and with her head of bed slightly elevated until she gets his AutoPap machine. Please note, that untreated obstructive sleep apnea may carry additional perioperative morbidity. Patients with significant obstructive sleep apnea should receive perioperative PAP therapy and the surgeons and particularly the anesthesiologist should be informed of the diagnosis and the severity of the sleep disordered breathing. The patient should be cautioned not to drive, work at heights, or operate dangerous or heavy equipment when tired or sleepy. Review and reiteration of good sleep hygiene measures should be pursued with any patient. Other causes of the patient's symptoms, including circadian rhythm disturbances, an underlying mood disorder, medication effect and/or an underlying medical problem cannot be ruled out based on this test. Clinical correlation is recommended. The patient and her referring provider will be notified of the test results. The patient will be seen in follow up in sleep clinic at [facility].   I certify that I have reviewed the raw data recording prior to the issuance of this report in accordance with the standards of the American Academy of Sleep Medicine (AASM).     INTERPRETING PHYSICIAN:    Huston Foley, MD, PhD  Board Certified in Neurology and Sleep Medicine   [facility] [address] [place], [place] [address] [phone]   INTERPRETING PHYSICIAN:   Huston Foley, MD, PhD  Board Certified in Neurology and Sleep Medicine  [facility] [address], [place], [place] [address] [phone]

## 2021-06-24 NOTE — Addendum Note (Signed)
Addended by: Huston Foley on: 06/24/2021 05:40 PM   Modules accepted: Orders

## 2021-06-24 NOTE — Progress Notes (Signed)
See procedure note.

## 2021-06-25 ENCOUNTER — Telehealth: Payer: Self-pay | Admitting: *Deleted

## 2021-06-25 NOTE — Telephone Encounter (Signed)
-----   Message from Huston Foley, MD sent at 06/24/2021  5:40 PM EDT ----- Urgent set up requested. Patient referred by Dr. Adriana Simas, seen by me on 06/03/2021, patient had a HST on 06/22/2021.    Please call and notify the patient that the recent home sleep test showed obstructive sleep apnea in the severe range. I recommend treatment for this in the form of autoPAP, which means, that we don't have to bring her in for a sleep study with CPAP, but will let her start using a so called autoPAP machine at home, through a DME company (of her choice, or as per insurance requirement). The DME representative will fit the patient with a mask of choice, educate her on how to use the machine, how to put the mask on, etc. I have placed an order in the chart. Please send the order to a local DME, talk to patient, send report to referring MD. Please also reinforce the need for compliance with treatment. We will need a FU in sleep clinic for 10 weeks post-PAP set up, please arrange that with me or one of our NPs. Thanks,   Huston Foley, MD, PhD [facility])

## 2021-06-25 NOTE — Telephone Encounter (Addendum)
Pt returned my call. We discussed pt's sleep study results. Pt understands the study showed severe OSA and she is amenable to proceeding recommendation of autoPAP. Discussed difference between AutoPap and CPAP.  Patient understands insurance compliance requirements which includes using the machine at least 4 hours at night and also being seen in the office between 30 and 90 days after set up.  Patient scheduled an initial f/u appointment for July 31 at 12:45 PM arrival 15 to 30 minutes early with machine and power cord.  Her questions were answered.  Preference of DME company is: 1. [facility] 2. [facility] 3. A [facility] company.  Patient aware we will send the orders over to [facility].  She should get a call within a week.  Patient was advised to give them a call if she does not hear within that time frame.  She verbalized appreciation.   Order, sleep study, office note, insurance info all faxed to [employer]. Requested urgent setup. Received a receipt of confirmation. F/u letter sent to pt via mychart.

## 2021-06-25 NOTE — Telephone Encounter (Signed)
Called pt & LVM (ok per DPR) asking for call back discuss sleep study results. Advised SS showed severe OSA and Dr Frances Furbish recommends patient start autoPAP urgently. Left office number in message.

## 2021-07-14 ENCOUNTER — Ambulatory Visit: Payer: BC Managed Care – PPO | Admitting: Family Medicine

## 2021-07-14 ENCOUNTER — Encounter: Payer: Self-pay | Admitting: Family Medicine

## 2021-07-14 VITALS — BP 134/82 | HR 79 | Temp 98.0°F | Wt 190.2 lb

## 2021-07-14 DIAGNOSIS — F419 Anxiety disorder, unspecified: Secondary | ICD-10-CM

## 2021-07-14 DIAGNOSIS — R739 Hyperglycemia, unspecified: Secondary | ICD-10-CM | POA: Diagnosis not present

## 2021-07-14 DIAGNOSIS — I10 Essential (primary) hypertension: Secondary | ICD-10-CM

## 2021-07-14 DIAGNOSIS — M25562 Pain in left knee: Secondary | ICD-10-CM | POA: Insufficient documentation

## 2021-07-14 DIAGNOSIS — G4733 Obstructive sleep apnea (adult) (pediatric): Secondary | ICD-10-CM

## 2021-07-14 DIAGNOSIS — E785 Hyperlipidemia, unspecified: Secondary | ICD-10-CM

## 2021-07-14 DIAGNOSIS — Z130 Encounter for screening for diseases of the blood and blood-forming organs and certain disorders involving the immune mechanism: Secondary | ICD-10-CM

## 2021-07-14 MED ORDER — POTASSIUM CHLORIDE ER 10 MEQ PO TBCR
10.0000 meq | EXTENDED_RELEASE_TABLET | Freq: Every day | ORAL | 3 refills | Status: DC
Start: 2021-07-14 — End: 2022-02-24

## 2021-07-14 MED ORDER — DICLOFENAC SODIUM 75 MG PO TBEC: 75.0000 mg | DELAYED_RELEASE_TABLET | Freq: Two times a day (BID) | ORAL | 0 refills | Status: DC | PRN

## 2021-07-14 MED ORDER — HYDROCHLOROTHIAZIDE 25 MG PO TABS: ORAL_TABLET | ORAL | 3 refills | Status: DC

## 2021-07-14 MED ORDER — PRAVASTATIN SODIUM 20 MG PO TABS: 20.0000 mg | ORAL_TABLET | Freq: Every day | ORAL | 3 refills | Status: DC

## 2021-07-14 MED ORDER — FLUOXETINE HCL 40 MG PO CAPS: ORAL_CAPSULE | ORAL | 3 refills | Status: DC

## 2021-07-14 MED ORDER — AMLODIPINE BESYLATE 10 MG PO TABS: 10.0000 mg | ORAL_TABLET | Freq: Every day | ORAL | 3 refills | Status: DC

## 2021-07-14 NOTE — Assessment & Plan Note (Signed)
Diclofenac as directed.

## 2021-07-14 NOTE — Progress Notes (Signed)
Subjective:  Patient ID: Gabrielle Little, female    DOB: 1965-11-17  Age: 56 y.o. MRN: [id number]  CC: Chief Complaint  Patient presents with   Hypertension    Blood pressure doing well per patient.     HPI:  56 year old female with hypertension, OSA, anxiety, hyperlipidemia, obesity presents for follow-up.  Blood pressure is well controlled on amlodipine and HCTZ.  She is doing well at this time.  Needs refills.  Anxiety is stable on Prozac.  Lipids have been stable on pravastatin.  Needs labs.  Patient recently got diagnosed with OSA and has just started using CPAP.  Doing fine at this time.  Patient reports that she has recently been bothered by left knee pain.  She states that she has had this previously.  Has been worse since she has been working quite a bit and has been standing on her feet most of the day.  She has previously been treated with diclofenac.  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   Anxiety 07/26/2014   Hypertension 07/20/2012   Hyperlipemia 07/20/2012    Social Hx   Social History   Socioeconomic History   Marital status: Married    Spouse name: Not on file   Number of children: Not on file   Years of education: Not on file   Highest education level: Not on file  Occupational History   Not on file  Tobacco Use   Smoking status: Never   Smokeless tobacco: Never  Vaping Use   Vaping Use: Never used  Substance and Sexual Activity   Alcohol use: No   Drug use: No   Sexual activity: Yes    Birth control/protection: Surgical    Comment: tubal  Other Topics Concern   Not on file  Social History Narrative   Not on file   Social Determinants of Health   Financial Resource Strain: Low Risk  (05/12/2020)   Overall Financial Resource Strain (CARDIA)    Difficulty of Paying Living Expenses: Not very hard  Food  Insecurity: No Food Insecurity (05/12/2020)   Hunger Vital Sign    Worried About Running Out of Food in the Last Year: Never true    Ran Out of Food in the Last Year: Never true  Transportation Needs: No Transportation Needs (05/12/2020)   PRAPARE - Hydrologist (Medical): No    Lack of Transportation (Non-Medical): No  Physical Activity: Insufficiently Active (05/12/2020)   Exercise Vital Sign    Days of Exercise per Week: 1 day    Minutes of Exercise per Session: 20 min  Stress: No Stress Concern Present (05/12/2020)   [place]    Feeling of Stress : Only a little  Social Connections: Socially Integrated (05/12/2020)   Social Connection and Isolation Panel [NHANES]    Frequency of Communication with Friends and Family: Three times a week    Frequency of Social Gatherings with Friends and Family: Twice a week    Attends Religious Services: More than 4 times per year    Active Member of [employer] or Organizations: Yes    Attends Archivist Meetings: 1 to 4 times per year    Marital Status: Married    Review of Systems  Constitutional: Negative.   Respiratory: Negative.  Cardiovascular: Negative.   Musculoskeletal:        Left knee pain.    Objective:  BP 134/82   Pulse 79   Temp 98 F (36.7 C)   Wt 190 lb 3.2 oz (86.3 kg)   SpO2 97%   BMI 35.94 kg/m      07/14/2021    8:45 AM 06/03/2021   12:46 PM 04/15/2021   10:31 AM  BP/Weight  Systolic BP 134 131 98  Diastolic BP 82 78 54  Wt. (Lbs) 190.2 186.4   BMI 35.94 kg/m2 35.22 kg/m2     Physical Exam Constitutional:      General: She is not in acute distress.    Appearance: Normal appearance.  HENT:     Head: Normocephalic and atraumatic.  Cardiovascular:     Rate and Rhythm: Normal rate and regular rhythm.  Pulmonary:     Effort: Pulmonary effort is normal.     Breath sounds: Normal breath sounds. No wheezing,  rhonchi or rales.  Musculoskeletal:     Comments: Left knee -no discrete areas of tenderness.  No effusion.  Neurological:     Mental Status: She is alert.  Psychiatric:        Mood and Affect: Mood normal.        Behavior: Behavior normal.     Lab Results  Component Value Date   WBC 7.0 05/07/2020   HGB 14.2 05/07/2020   HCT 42.7 05/07/2020   PLT 328 05/07/2020   GLUCOSE 108 (H) 04/13/2021   CHOL 164 05/07/2020   TRIG 92 05/07/2020   HDL 56 05/07/2020   LDLCALC 89 05/07/2020   ALT 19 05/07/2020   AST 14 05/07/2020   NA 139 04/13/2021   K 3.9 04/13/2021   CL 103 04/13/2021   CREATININE 0.76 04/13/2021   BUN 12 04/13/2021   CO2 28 04/13/2021   TSH 3.462 08/18/2012     Assessment & Plan:   Problem List Items Addressed This Visit       Cardiovascular and Mediastinum   Hypertension - Primary    Stable.  Continue amlodipine and HCTZ.  Medications refilled.      Relevant Medications   amLODipine (NORVASC) 10 MG tablet   hydrochlorothiazide (HYDRODIURIL) 25 MG tablet   pravastatin (PRAVACHOL) 20 MG tablet   Other Relevant Orders   CMP14+EGFR     Respiratory   OSA (obstructive sleep apnea)    Advised compliance with CPAP.        Other   Acute pain of left knee    Diclofenac as directed.      Anxiety    Stable.  Continue Prozac.      Relevant Medications   FLUoxetine (PROZAC) 40 MG capsule   Hyperlipemia    Continue statin.  Awaiting labs.      Relevant Medications   amLODipine (NORVASC) 10 MG tablet   hydrochlorothiazide (HYDRODIURIL) 25 MG tablet   pravastatin (PRAVACHOL) 20 MG tablet   Other Relevant Orders   Lipid panel   Other Visit Diagnoses     Blood glucose elevated       Relevant Orders   Hemoglobin A1c   Screening for deficiency anemia       Relevant Orders   CBC       Meds ordered this encounter  Medications   amLODipine (NORVASC) 10 MG tablet    Sig: Take 1 tablet (10 mg total) by mouth at bedtime.    Dispense:  90  tablet  Refill:  3   FLUoxetine (PROZAC) 40 MG capsule    Sig: TAKE ONE CAPSULE ($RemoveBefo'40MG'ZZJTbpBeIXm$  TOTAL) BY MOUTH DAILY    Dispense:  90 capsule    Refill:  3   hydrochlorothiazide (HYDRODIURIL) 25 MG tablet    Sig: TAKE ONE TABLET ($RemoveBef'25MG'tnkWSlUWvX$  TOTAL) BY MOUTH DAILY    Dispense:  90 tablet    Refill:  3   potassium chloride (KLOR-CON 10) 10 MEQ tablet    Sig: Take 1 tablet (10 mEq total) by mouth daily.    Dispense:  90 tablet    Refill:  3   pravastatin (PRAVACHOL) 20 MG tablet    Sig: Take 1 tablet (20 mg total) by mouth at bedtime.    Dispense:  90 tablet    Refill:  3   diclofenac (VOLTAREN) 75 MG EC tablet    Sig: Take 1 tablet (75 mg total) by mouth 2 (two) times daily as needed for moderate pain.    Dispense:  60 tablet    Refill:  0    Follow-up:  Return in about 6 months (around 01/13/2022).  [place]

## 2021-07-14 NOTE — Assessment & Plan Note (Signed)
-   Stable; Continue Prozac 

## 2021-07-14 NOTE — Assessment & Plan Note (Signed)
Continue statin.  Awaiting labs.

## 2021-07-14 NOTE — Assessment & Plan Note (Signed)
Stable.  Continue amlodipine and HCTZ.  Medications refilled.

## 2021-07-14 NOTE — Patient Instructions (Signed)
I have ordered your labs.  I have refilled your medication.  Follow-up in 6 months.  Take care  Dr. Adriana Simas

## 2021-07-14 NOTE — Assessment & Plan Note (Signed)
Advised compliance with CPAP.

## 2021-08-27 NOTE — Telephone Encounter (Addendum)
Contacted pt to remind her to bring CPAP machine into initial appt Monday 7/31. Pt verbally understood

## 2021-08-27 NOTE — Progress Notes (Signed)
[facility] [address] [place]. [place] [address] [phone]       OFFICE FOLLOW UP NOTE  Ms. Danton Clap Date of Birth:  08/14/1965 Medical Record Number:  [id number]   Reason for visit: Initial CPAP follow-up   SUBJECTIVE:   CHIEF COMPLAINT:  Chief Complaint  Patient presents with   Obstructive Sleep Apnea    Rm 3 with spouse Tiburcio Bash Pt is well and stable,no concerns with CPAP     HPI:   Update 08/27/2021 JM: Patient returns for initial CPAP follow-up visit.  Evaluated by Dr. Frances Furbish on 06/03/2021 with completion of HST 5/22 which showed severe OSA with total AHI of 70.4/h and O2 nadir of 80%.  Recommend initiating AutoPap which was started on 6/9.   Review of initial compliance period below.  Reports tolerating CPAP well, notes improvement of daytime fatigue and more restful sleep. Husband reported her snoring very mildly only one night since starting CPAP. She does note occasional leaking around nasal pillow tubing.  She has had some difficulty using more recently as she has been falling asleep in her recliner chair or needing to sleep in the recliner due to "female issues".  Epworth Sleepiness Scale 4/24 (prior 11/24).          History provided for reference purposes only Consult visit 06/03/2021 Dr. Frances Furbish:  Ms. Ybanez is a 56 year old right-handed woman with an underlying medical history of hypertension, hyperlipidemia, anxiety, allergic rhinitis, and obesity, who was previously diagnosed with obstructive sleep apnea and placed on PAP therapy.  I reviewed your office note from 01/13/2021.  Prior sleep study results were reviewed today.  She had a split-night sleep study on 08/06/2012, study was conducted at [facility] and study was interpreted by Dr. Beryle Beams.  She had a baseline AHI of 51/h, O2 nadir 78% during REM sleep.  She was titrated on CPAP between 5 and 15 cm with an optimal pressure deemed at 13 cm.  She reports that she never actually  got her own machine, she was using her father's machine as he was not using his machine at the time.  It was an older model and it was cumbersome to use.  She was not consistent with it and has not used CPAP in about 4 years by her estimate.  A compliance report is not available as she has not used CPAP in years.  She has an Epworth sleepiness score of 11 out of 24, fatigue severity score is 17 out of 63.  Per husband, she snores loudly and she has witnessed apneas per sister's observation.  Her bedtime is around 10 PM and rise time around 4:45 AM.  She works part-time as an Wellsite geologist, about 3 hours a day and part-time at [employer].  She has caffeine in the form of tea, about 2 servings per day, no daily coffee, occasional soda.  She does not drink any alcohol.  She is a non-smoker.  They have 3 grown children, 1 dog in the household.  She does have a TV on in her bedroom at night and her husband puts it on a timer.  She denies recurrent morning headaches.  She has nocturia about 3 times per average night.  She has slowly gained weight over the past 8 years in the realm of 20 pounds.  Her father had sleep apnea and her brother has sleep apnea and is on a CPAP machine.   ROS:   14 system review of systems performed  and negative with exception of those listed in HPI  PMH:  Past Medical History:  Diagnosis Date   Allergic rhinitis    Anxiety    Hyperlipidemia    Hypertension    Sleep apnea     PSH:  Past Surgical History:  Procedure Laterality Date   CESAREAN SECTION     COLONOSCOPY WITH PROPOFOL N/A 04/15/2021   Procedure: COLONOSCOPY WITH PROPOFOL;  Surgeon: Malissa Hippo, MD;  Location: [facility];  Service: Endoscopy;  Laterality: N/A;  10:00 - ASA I/II   DILATION AND CURETTAGE OF UTERUS     POLYPECTOMY  04/15/2021   Procedure: POLYPECTOMY;  Surgeon: Malissa Hippo, MD;  Location: [facility];  Service: Endoscopy;;    Social History:  Social History   Socioeconomic History    Marital status: Married    Spouse name: Not on file   Number of children: Not on file   Years of education: Not on file   Highest education level: Not on file  Occupational History   Not on file  Tobacco Use   Smoking status: Never   Smokeless tobacco: Never  Vaping Use   Vaping Use: Never used  Substance and Sexual Activity   Alcohol use: No   Drug use: No   Sexual activity: Yes    Birth control/protection: Surgical    Comment: tubal  Other Topics Concern   Not on file  Social History Narrative   Not on file   Social Determinants of Health   Financial Resource Strain: Low Risk  (05/12/2020)   Overall Financial Resource Strain (CARDIA)    Difficulty of Paying Living Expenses: Not very hard  Food Insecurity: No Food Insecurity (05/12/2020)   Hunger Vital Sign    Worried About Running Out of Food in the Last Year: Never true    Ran Out of Food in the Last Year: Never true  Transportation Needs: No Transportation Needs (05/12/2020)   PRAPARE - Administrator, Civil Service (Medical): No    Lack of Transportation (Non-Medical): No  Physical Activity: Insufficiently Active (05/12/2020)   Exercise Vital Sign    Days of Exercise per Week: 1 day    Minutes of Exercise per Session: 20 min  Stress: No Stress Concern Present (05/12/2020)   [employer] of Occupational Health - Occupational Stress Questionnaire    Feeling of Stress : Only a little  Social Connections: Socially Integrated (05/12/2020)   Social Connection and Isolation Panel [NHANES]    Frequency of Communication with Friends and Family: Three times a week    Frequency of Social Gatherings with Friends and Family: Twice a week    Attends Religious Services: More than 4 times per year    Active Member of [employer] or Organizations: Yes    Attends Banker Meetings: 1 to 4 times per year    Marital Status: Married  Catering manager Violence: Not At Risk (05/12/2020)   Humiliation, Afraid, Rape,  and Kick questionnaire    Fear of Current or Ex-Partner: No    Emotionally Abused: No    Physically Abused: No    Sexually Abused: No    Family History:  Family History  Problem Relation Age of Onset   Hypertension Mother    Osteoporosis Mother    Heart attack Father 65       first MI   Hypertension Father    Diabetes Father    Sleep apnea Father    Sleep apnea  Sister    Sleep apnea Brother    Cancer Maternal Aunt        breast   Hypertension Maternal Grandfather    Diabetes Paternal Grandmother     Medications:   Current Outpatient Medications on File Prior to Visit  Medication Sig Dispense Refill   amLODipine (NORVASC) 10 MG tablet Take 1 tablet (10 mg total) by mouth at bedtime. 90 tablet 3   diclofenac (VOLTAREN) 75 MG EC tablet Take 1 tablet (75 mg total) by mouth 2 (two) times daily as needed for moderate pain. 60 tablet 0   FLUoxetine (PROZAC) 40 MG capsule TAKE ONE CAPSULE (40MG  TOTAL) BY MOUTH DAILY 90 capsule 3   hydrochlorothiazide (HYDRODIURIL) 25 MG tablet TAKE ONE TABLET (25MG  TOTAL) BY MOUTH DAILY (Patient taking differently: TAKE ONE TABLET (25MG  TOTAL) BY MOUTH EVERY THURSDAY) 90 tablet 3   Lysine 500 MG TABS Take 500 mg by mouth daily.     Multiple Vitamin (MULTIVITAMIN WITH MINERALS) TABS tablet Take 1 tablet by mouth daily.     potassium chloride (KLOR-CON 10) 10 MEQ tablet Take 1 tablet (10 mEq total) by mouth daily. (Patient taking differently: Take 10 mEq by mouth every other day.) 90 tablet 3   pravastatin (PRAVACHOL) 20 MG tablet Take 1 tablet (20 mg total) by mouth at bedtime. 90 tablet 3   No current facility-administered medications on file prior to visit.    Allergies:   Allergies  Allergen Reactions   Biaxin [Clarithromycin] Rash   Clindamycin/Lincomycin Rash    Patient had angioedema extensive rash 2 days after stopping clindamycin-probable allergy      OBJECTIVE:  Physical Exam  Vitals:   08/31/21 1236  BP: 123/73  Pulse: 67   Weight: 187 lb (84.8 kg)  Height: 5\' 1"  (1.549 m)   Body mass index is 35.33 kg/m. No results found.  General: well developed, well nourished, very pleasant middle-age Caucasian female, seated, in no evident distress Head: head normocephalic and atraumatic.   Neck: supple with no carotid or supraclavicular bruits Cardiovascular: regular rate and rhythm, no murmurs Musculoskeletal: no deformity Skin:  no rash/petichiae Vascular:  Normal pulses all extremities   Neurologic Exam Mental Status: Awake and fully alert. Oriented to place and time. Recent and remote memory intact. Attention span, concentration and fund of knowledge appropriate. Mood and affect appropriate.  Cranial Nerves: Pupils equal, briskly reactive to light. Extraocular movements full without nystagmus. Visual fields full to confrontation. Hearing intact. Facial sensation intact. Face, tongue, palate moves normally and symmetrically.  Motor: Normal bulk and tone. Normal strength in all tested extremity muscles Sensory.: intact to touch , pinprick , position and vibratory sensation.  Coordination: Rapid alternating movements normal in all extremities. Finger-to-nose and heel-to-shin performed accurately bilaterally. Gait and Station: Arises from chair without difficulty. Stance is normal. Gait demonstrates normal stride length and balance without use of AD. Tandem walk and heel toe without difficulty.  Reflexes: 1+ and symmetric. Toes downgoing.         ASSESSMENT: ANNA BEAIRD is a 56 y.o. year old female diagnosed with severe sleep apnea per HST 06/2021 and initiation of AutoPap 6/9.      PLAN:  OSA on CPAP : Residual AHI slightly elevated (AHI 5.5) and discussed importance of increasing nightly compliance and ensuring greater than 4 hours per night as well as discuss leaking concern with DME company to ensure proper fit.  Continue current AutoPap settings but will adjust at follow up visit if needed.  Continue  to follow with DME company for any needed supplies or CPAP related concerns.    Follow up in 6 months or call earlier if needed   CC:  PCP: Tommie Sams, DO    I spent 24 minutes of face-to-face and non-face-to-face time with patient and husband.  This included previsit chart review, lab review, study review, order entry, electronic health record documentation, patient and husband education regarding sleep apnea and HST report with review and discussion of compliance report and answered all other questions to patient's satisfaction   Ihor Austin, [facility]  [facility] [address] [place], [place] [address]  Phone [phone] Fax [phone] Note: This document was prepared with digital dictation and possible smart phrase technology. Any transcriptional errors that result from this process are unintentional.

## 2021-08-31 ENCOUNTER — Ambulatory Visit (INDEPENDENT_AMBULATORY_CARE_PROVIDER_SITE_OTHER): Payer: BC Managed Care – PPO | Admitting: Adult Health

## 2021-08-31 ENCOUNTER — Ambulatory Visit: Payer: BC Managed Care – PPO | Admitting: Adult Health

## 2021-08-31 ENCOUNTER — Encounter: Payer: Self-pay | Admitting: Adult Health

## 2021-08-31 VITALS — BP 123/73 | HR 67 | Ht 61.0 in | Wt 187.0 lb

## 2021-08-31 VITALS — BP 134/84 | Ht 61.0 in | Wt 187.5 lb

## 2021-08-31 DIAGNOSIS — N921 Excessive and frequent menstruation with irregular cycle: Secondary | ICD-10-CM

## 2021-08-31 DIAGNOSIS — G4733 Obstructive sleep apnea (adult) (pediatric): Secondary | ICD-10-CM

## 2021-08-31 DIAGNOSIS — Z9989 Dependence on other enabling machines and devices: Secondary | ICD-10-CM

## 2021-08-31 NOTE — Patient Instructions (Signed)
Please ensure you increase use of your CPAP to nightly and ensure you are using CPAP at least greater than 4 hours per night for optimal benefit and for insurance purposes although use throughout the entire night is preferred  Please discuss leaking concerns with your DME company  Continue to follow with DME company for any needed supplies or CPAP related concerns    Follow-up in 6 months or call earlier if needed

## 2021-08-31 NOTE — Progress Notes (Signed)
  Subjective:     Patient ID: Gabrielle Little, female   DOB: 1966-01-08, 56 y.o.   MRN: [id number]  HPI [name] is a 56 year old white female, married, [id number], in complaining of irregular bleeding, about every 2 months. Had period in July that was heavy and painful.  Pap was 12/11/19 normal PCP is Dr Adriana Simas.  Review of Systems Irregular periods Heavy bleeding Had pain with this last period Reviewed past medical,surgical, social and family history. Reviewed medications and allergies.     Objective:   Physical Exam BP 134/84 (BP Location: Left Arm, Patient Position: Sitting, Cuff Size: Normal)   Ht 5\' 1"  (1.549 m)   Wt 187 lb 8 oz (85 kg)   LMP 08/25/2021 (Approximate)   BMI 35.43 kg/m     Skin warm and dry.Pelvic: external genitalia is normal in appearance no lesions, vagina: white discharge with odor,urethra has no lesions or masses noted, cervix:smooth and bulbous, uterus: normal size, shape and contour, non tender, no masses felt, adnexa: no masses or tenderness noted. Bladder is non tender and no masses felt.  Fall risk is low  [place] - 08/31/21 1043       Pregnancy Intention Screening   Does the patient want to become pregnant in the next year? No    Does the patient's partner want to become pregnant in the next year? No    Would the patient like to discuss contraceptive options today? No      Contraception Wrap Up   Current Method Female Sterilization    End Method Female Sterilization    Contraception Counseling Provided No            Examination chaperoned by 09/02/21 LPN  Assessment:     1. Menorrhagia with irregular cycle Will check labs Will get pelvic Malachy Mood to assess uterine lining, at [facility] 09/07/21 at 2:30 pm  - CBC - TSH - Follicle stimulating hormone - 11/07/21 PELVIC COMPLETE WITH TRANSVAGINAL; Future     Plan:    Will talk when labs and [place] back  Follow up TBD

## 2021-09-02 LAB — CBC
Hematocrit: 42.3 % (ref 34.0–46.6)
Hemoglobin: 13.9 g/dL (ref 11.1–15.9)
MCH: 27.6 pg (ref 26.6–33.0)
MCHC: 32.9 g/dL (ref 31.5–35.7)
MCV: 84 fL (ref 79–97)
Platelets: 261 10*3/uL (ref 150–450)
RBC: 5.03 x10E6/uL (ref 3.77–5.28)
RDW: 13.9 % (ref 11.7–15.4)
WBC: 6.6 10*3/uL (ref 3.4–10.8)

## 2021-09-02 LAB — TSH: TSH: 1.84 u[IU]/mL (ref 0.450–4.500)

## 2021-09-02 LAB — FOLLICLE STIMULATING HORMONE: FSH: 43.9 m[IU]/mL

## 2021-09-07 ENCOUNTER — Ambulatory Visit (HOSPITAL_COMMUNITY)
Admission: RE | Admit: 2021-09-07 | Discharge: 2021-09-07 | Disposition: A | Discharge status: Home / Self Care | Payer: BC Managed Care – PPO | Source: Ambulatory Visit | Attending: Adult Health | Admitting: Adult Health

## 2021-09-07 DIAGNOSIS — N921 Excessive and frequent menstruation with irregular cycle: Secondary | ICD-10-CM | POA: Diagnosis present

## 2021-09-08 ENCOUNTER — Telehealth: Payer: Self-pay | Admitting: Adult Health

## 2021-09-08 ENCOUNTER — Encounter: Payer: Self-pay | Admitting: Adult Health

## 2021-09-08 DIAGNOSIS — N83209 Unspecified ovarian cyst, unspecified side: Secondary | ICD-10-CM

## 2021-09-08 DIAGNOSIS — R9389 Abnormal findings on diagnostic imaging of other specified body structures: Secondary | ICD-10-CM

## 2021-09-08 DIAGNOSIS — N83201 Unspecified ovarian cyst, right side: Secondary | ICD-10-CM

## 2021-09-08 HISTORY — DX: Abnormal findings on diagnostic imaging of other specified body structures: R93.89

## 2021-09-08 HISTORY — DX: Unspecified ovarian cyst, unspecified side: N83.209

## 2021-09-08 NOTE — Telephone Encounter (Signed)
Pt aware that endometrium is thickened and needs biopsy, will get in THursday with Dr Charlotta Newton and had cyst on ovaries will need follow up US in about 8 weeks per radiology but will get Dr Charlotta Newton to check

## 2021-09-10 ENCOUNTER — Other Ambulatory Visit (HOSPITAL_COMMUNITY)
Admission: RE | Admit: 2021-09-10 | Discharge: 2021-09-10 | Disposition: A | Discharge status: Home / Self Care | Payer: BC Managed Care – PPO | Source: Ambulatory Visit | Attending: Obstetrics & Gynecology | Admitting: Obstetrics & Gynecology

## 2021-09-10 ENCOUNTER — Ambulatory Visit (INDEPENDENT_AMBULATORY_CARE_PROVIDER_SITE_OTHER): Payer: BC Managed Care – PPO | Admitting: Obstetrics & Gynecology

## 2021-09-10 ENCOUNTER — Telehealth: Payer: Self-pay

## 2021-09-10 ENCOUNTER — Encounter: Payer: Self-pay | Admitting: Obstetrics & Gynecology

## 2021-09-10 VITALS — BP 133/77 | HR 76 | Ht 61.0 in | Wt 189.0 lb

## 2021-09-10 DIAGNOSIS — N83202 Unspecified ovarian cyst, left side: Secondary | ICD-10-CM

## 2021-09-10 DIAGNOSIS — N939 Abnormal uterine and vaginal bleeding, unspecified: Secondary | ICD-10-CM | POA: Diagnosis present

## 2021-09-10 DIAGNOSIS — R739 Hyperglycemia, unspecified: Secondary | ICD-10-CM

## 2021-09-10 DIAGNOSIS — N83201 Unspecified ovarian cyst, right side: Secondary | ICD-10-CM | POA: Diagnosis not present

## 2021-09-10 DIAGNOSIS — I10 Essential (primary) hypertension: Secondary | ICD-10-CM | POA: Diagnosis not present

## 2021-09-10 DIAGNOSIS — E785 Hyperlipidemia, unspecified: Secondary | ICD-10-CM

## 2021-09-10 DIAGNOSIS — Z79899 Other long term (current) drug therapy: Secondary | ICD-10-CM

## 2021-09-10 NOTE — Telephone Encounter (Signed)
Lab orders re ordered. Left message to return call

## 2021-09-10 NOTE — Telephone Encounter (Signed)
Caller name:Arwyn Thivierge   On DPR? :Yes  Call back [phone]  Provider they see: Adriana Simas   Reason for call:Pt needs blood work reordered and does not need CBC she had done at [facility]

## 2021-09-10 NOTE — Progress Notes (Signed)
GYN VISIT Patient name: Gabrielle Little MRN [id number]  Date of birth: May 13, 1965 Chief Complaint:   endometrial biopsy  History of Present Illness:   Gabrielle Little is a 56 y.o. [phone] perimenopausal female being seen today for the following concerns:  -AUB: She reports that they have been every 2 mos for about the past 33mos.  Prior to that she was taking OCPs and then stopped the pill.  Sept-December no period.  Then every 2 mos; however then in July had dysmenorrhea and light spotting then became heavy.  On heavy day using about 2 pads per day- never previously that heavy.  Bleeding had stopped for a few days then restarted after [place]- only dark spotting when wiped.  Denies pelvic pain.  No other acute concerns  Patient's last menstrual period was 08/25/2021 (approximate).     07/14/2021    8:45 AM 06/18/2020   11:21 AM 05/12/2020    3:00 PM 12/11/2019    1:40 PM 05/03/2017    9:06 AM  Depression screen PHQ 2/9  Decreased Interest 0 0 0 0 0  Down, Depressed, Hopeless 0 0 0 0 0  PHQ - 2 Score 0 0 0 0 0  Altered sleeping  1 1 1    Tired, decreased energy  0 0 1   Change in appetite  0 0 0   Feeling bad or failure about yourself   0 0 0   Trouble concentrating  0 0 0   Moving slowly or fidgety/restless  0 0 0   Suicidal thoughts  0 0 0   PHQ-9 Score  1 1 2    Difficult doing work/chores  Not difficult at all  Not difficult at all      Review of Systems:   Pertinent items are noted in HPI Denies fever/chills, dizziness, headaches, visual disturbances, fatigue, shortness of breath, chest pain, abdominal pain, vomiting, denies problems with bowel movements, urination, or intercourse unless otherwise stated above.  Pertinent History Reviewed:  Reviewed past medical,surgical, social, obstetrical and family history.  Reviewed problem list, medications and allergies. Physical Assessment:   Vitals:   09/10/21 1603  BP: 133/77  Pulse: 76  Weight: 189 lb (85.7 kg)  Height: 5\' 1"   (1.549 m)  Body mass index is 35.71 kg/m.       Physical Examination:   General appearance: alert, well appearing, and in no distress  Psych: mood appropriate, normal affect  Skin: warm & dry   Cardiovascular: normal heart rate noted  Respiratory: normal respiratory effort, no distress  Abdomen: soft, non-tender   Pelvic: VULVA: normal appearing vulva with no masses, tenderness or lesions, VAGINA: normal appearing vagina with normal color, small clumps of dark old blood noted within vault CERVIX: no lesions, dark blood noted at cervical os  Extremities: no edema   Chaperone:    11/10/21 reviewed: 8.9cm uterus with no masses. 29mm endometrium.  Right ovary with 2.1x1.2x1.7complex cystic structure.  Left ovary with 2.9x1.9x2.8cm complex cystic structure   Endometrial Biopsy Procedure Note  Pre-operative Diagnosis: AUB  Post-operative Diagnosis: same  Procedure Details  The risks (including infection, bleeding, pain, and uterine perforation) and benefits of the procedure were explained to the patient and Written informed consent was obtained.  Antibiotic prophylaxis against endocarditis was not indicated.   The patient was placed in the dorsal lithotomy position.  Bimanual exam showed the uterus to be in the neutral position.  A speculum inserted in the vagina, and the cervix prepped  with betadine.     A single tooth tenaculum was applied to the anterior lip of the cervix for stabilization.  Os finder was used as stenosis was noted. A sterile uterine sound was used to sound the uterus to a depth of 8cm.  A Pipelle endometrial aspirator was used to sample the endometrium.  Sample was sent for pathologic examination.  Condition: Stable  Complications: None  Assessment & Plan:  1) AUB -while mostly likely perimenopausal status discussed concerns of hyperplasia/malignancy -EMB obtained, further management pending results -discussed management with progesterone at this time to  decreased bleeding, pt declined.  []  encouraged pt to reconsider management pending results  2) Bilateral Ovarian cysts -plan for repeat in several wks -currently asymptomatic, incidental finding  -due to h/o cHTN, pt should not longer be on OCPs  Return in about 3 months (around 12/11/2021) for follow up, please also schdule pelvic 13/11/2021 in 10wk.   [place], DO Attending Obstetrician & Gynecologist, [facility] for [facility]

## 2021-09-10 NOTE — Telephone Encounter (Signed)
Pt returned call and verbalized understanding  

## 2021-09-14 LAB — SURGICAL PATHOLOGY

## 2021-09-15 ENCOUNTER — Other Ambulatory Visit: Payer: Self-pay | Admitting: Obstetrics & Gynecology

## 2021-09-15 DIAGNOSIS — N939 Abnormal uterine and vaginal bleeding, unspecified: Secondary | ICD-10-CM

## 2021-09-15 MED ORDER — NORETHINDRONE 0.35 MG PO TABS: 1.0000 | ORAL_TABLET | Freq: Every day | ORAL | 4 refills | Status: DC

## 2021-09-15 NOTE — Progress Notes (Signed)
Reviewed EMB results- benign.  Discussed options of monitoring bleeding vs starting low dose progesterone.  Plan for POPs and follow up in 36mos.  Myna Hidalgo, DO Attending Obstetrician & Gynecologist, [facility] for [employer], [facility]

## 2021-11-17 ENCOUNTER — Other Ambulatory Visit: Payer: Self-pay | Admitting: Obstetrics & Gynecology

## 2021-11-17 ENCOUNTER — Telehealth: Payer: Self-pay | Admitting: Obstetrics & Gynecology

## 2021-11-17 DIAGNOSIS — N939 Abnormal uterine and vaginal bleeding, unspecified: Secondary | ICD-10-CM

## 2021-11-17 DIAGNOSIS — N83201 Unspecified ovarian cyst, right side: Secondary | ICD-10-CM

## 2021-11-17 NOTE — Progress Notes (Signed)
Order for [place] at [facility]

## 2021-11-17 NOTE — Telephone Encounter (Signed)
Spoke to patient today and she wants to know if you still/want her to do the ultra sound. She seems to think it's not necessary for another ultra sound after doing the endom biopsy please advise

## 2021-11-18 ENCOUNTER — Other Ambulatory Visit: Payer: BC Managed Care – PPO

## 2021-11-24 ENCOUNTER — Ambulatory Visit (HOSPITAL_COMMUNITY)
Admission: RE | Admit: 2021-11-24 | Discharge: 2021-11-24 | Disposition: A | Discharge status: Home / Self Care | Payer: BC Managed Care – PPO | Source: Ambulatory Visit | Attending: Obstetrics & Gynecology | Admitting: Obstetrics & Gynecology

## 2021-11-24 DIAGNOSIS — N939 Abnormal uterine and vaginal bleeding, unspecified: Secondary | ICD-10-CM | POA: Diagnosis present

## 2021-11-24 DIAGNOSIS — N83201 Unspecified ovarian cyst, right side: Secondary | ICD-10-CM | POA: Diagnosis present

## 2021-11-24 DIAGNOSIS — N83202 Unspecified ovarian cyst, left side: Secondary | ICD-10-CM | POA: Insufficient documentation

## 2021-12-12 LAB — LIPID PANEL
Chol/HDL Ratio: 3.1 ratio (ref 0.0–4.4)
Cholesterol, Total: 175 mg/dL (ref 100–199)
HDL: 57 mg/dL (ref >39)
LDL Chol Calc (NIH): 101 mg/dL — ABNORMAL HIGH (ref 0–99)
Triglycerides: 95 mg/dL (ref 0–149)
VLDL Cholesterol Cal: 17 mg/dL (ref 5–40)

## 2021-12-12 LAB — CMP14+EGFR
ALT: 15 IU/L (ref 0–32)
AST: 15 IU/L (ref 0–40)
Albumin/Globulin Ratio: 2.1 (ref 1.2–2.2)
Albumin: 4.5 g/dL (ref 3.8–4.9)
Alkaline Phosphatase: 69 IU/L (ref 44–121)
BUN/Creatinine Ratio: 18 (ref 9–23)
BUN: 14 mg/dL (ref 6–24)
Bilirubin Total: 0.6 mg/dL (ref 0.0–1.2)
CO2: 27 mmol/L (ref 20–29)
Calcium: 9.8 mg/dL (ref 8.7–10.2)
Chloride: 99 mmol/L (ref 96–106)
Creatinine, Ser: 0.78 mg/dL (ref 0.57–1.00)
Globulin, Total: 2.1 g/dL (ref 1.5–4.5)
Glucose: 93 mg/dL (ref 70–99)
Potassium: 4.4 mmol/L (ref 3.5–5.2)
Sodium: 143 mmol/L (ref 134–144)
Total Protein: 6.6 g/dL (ref 6.0–8.5)
eGFR: 89 mL/min/{1.73_m2} (ref >59)

## 2021-12-12 LAB — HEMOGLOBIN A1C
Est. average glucose Bld gHb Est-mCnc: 120 mg/dL
Hgb A1c MFr Bld: 5.8 % — ABNORMAL HIGH (ref 4.8–5.6)

## 2022-01-07 ENCOUNTER — Ambulatory Visit: Payer: BC Managed Care – PPO | Admitting: Family Medicine

## 2022-01-13 ENCOUNTER — Ambulatory Visit: Payer: BC Managed Care – PPO | Admitting: Family Medicine

## 2022-02-09 ENCOUNTER — Ambulatory Visit: Payer: BC Managed Care – PPO | Admitting: Obstetrics & Gynecology

## 2022-02-16 ENCOUNTER — Ambulatory Visit (INDEPENDENT_AMBULATORY_CARE_PROVIDER_SITE_OTHER): Payer: BC Managed Care – PPO | Admitting: Family Medicine

## 2022-02-16 VITALS — BP 120/82 | HR 94 | Temp 98.1°F | Ht 61.0 in | Wt 186.0 lb

## 2022-02-16 DIAGNOSIS — E785 Hyperlipidemia, unspecified: Secondary | ICD-10-CM

## 2022-02-16 DIAGNOSIS — F419 Anxiety disorder, unspecified: Secondary | ICD-10-CM | POA: Diagnosis not present

## 2022-02-16 DIAGNOSIS — I10 Essential (primary) hypertension: Secondary | ICD-10-CM

## 2022-02-16 DIAGNOSIS — R7303 Prediabetes: Secondary | ICD-10-CM | POA: Insufficient documentation

## 2022-02-16 MED ORDER — AMLODIPINE BESYLATE 10 MG PO TABS: 10.0000 mg | ORAL_TABLET | Freq: Every day | ORAL | 3 refills | Status: DC

## 2022-02-16 MED ORDER — FLUOXETINE HCL 40 MG PO CAPS: ORAL_CAPSULE | ORAL | 3 refills | Status: DC

## 2022-02-16 MED ORDER — PRAVASTATIN SODIUM 20 MG PO TABS: 20.0000 mg | ORAL_TABLET | Freq: Every day | ORAL | 3 refills | Status: DC

## 2022-02-16 NOTE — Assessment & Plan Note (Signed)
Well-controlled.  Stopping HCTZ and potassium as it does not seem to be needed at this time given that her blood pressure is well-controlled.  Continue amlodipine.

## 2022-02-16 NOTE — Progress Notes (Signed)
Subjective:  Patient ID: Gabrielle Little, female    DOB: 1965-10-10  Age: 57 y.o. MRN: [id number]  CC: Chief Complaint  Patient presents with   Hypertension    HPI:  57 year old female presents for follow-up.  Patient's hypertension is well-controlled.  She is taking amlodipine.  Patient is taking hydrochlorothiazide every other day with potassium.  She has not taken HCTZ today.  Will discuss her medications today.  Denies chest pain or shortness of breath.  Has recently had viral respiratory infections.  Anxiety stable on Prozac.  Needs refill.  Lipids fairly well-controlled on pravastatin.  Needs refill.  Most recent LDL 101.  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   Anxiety 07/26/2014   Hypertension 07/20/2012   Hyperlipidemia 07/20/2012    Social Hx   Social History   Socioeconomic History   Marital status: Married    Spouse name: Not on file   Number of children: Not on file   Years of education: Not on file   Highest education level: Not on file  Occupational History   Not on file  Tobacco Use   Smoking status: Never   Smokeless tobacco: Never  Vaping Use   Vaping Use: Never used  Substance and Sexual Activity   Alcohol use: No   Drug use: No   Sexual activity: Yes    Birth control/protection: Surgical    Comment: tubal  Other Topics Concern   Not on file  Social History Narrative   Not on file   Social Determinants of Health   Financial Resource Strain: Low Risk  (05/12/2020)   Overall Financial Resource Strain (CARDIA)    Difficulty of Paying Living Expenses: Not very hard  Food Insecurity: No Food Insecurity (05/12/2020)   Hunger Vital Sign    Worried About Running Out of Food in the Last Year: Never true    Ran Out of Food in the Last Year: Never true  Transportation  Needs: No Transportation Needs (05/12/2020)   PRAPARE - Hydrologist (Medical): No    Lack of Transportation (Non-Medical): No  Physical Activity: Insufficiently Active (05/12/2020)   Exercise Vital Sign    Days of Exercise per Week: 1 day    Minutes of Exercise per Session: 20 min  Stress: No Stress Concern Present (05/12/2020)   [place]    Feeling of Stress : Only a little  Social Connections: Socially Integrated (05/12/2020)   Social Connection and Isolation Panel [NHANES]    Frequency of Communication with Friends and Family: Three times a week    Frequency of Social Gatherings with Friends and Family: Twice a week    Attends Religious Services: More than 4 times per year    Active Member of [employer] or Organizations: Yes    Attends Archivist Meetings: 1 to 4 times per year    Marital Status: Married    Review of Systems Per HPI  Objective:  BP 120/82   Pulse 94   Temp 98.1 F (36.7 C)   Ht 5\' 1"  (1.549 m)   Wt 186 lb (84.4 kg)   SpO2 97%   BMI 35.14 kg/m      02/16/2022    2:18 PM 09/10/2021    4:03 PM 08/31/2021  12:36 PM  BP/Weight  Systolic BP 427 062 376  Diastolic BP 82 77 73  Wt. (Lbs) 186 189 187  BMI 35.14 kg/m2 35.71 kg/m2 35.33 kg/m2    Physical Exam Constitutional:      General: She is not in acute distress.    Appearance: Normal appearance. She is obese.  HENT:     Head: Normocephalic and atraumatic.  Cardiovascular:     Rate and Rhythm: Normal rate and regular rhythm.  Pulmonary:     Effort: Pulmonary effort is normal.     Breath sounds: Normal breath sounds. No wheezing, rhonchi or rales.  Neurological:     Mental Status: She is alert.  Psychiatric:        Mood and Affect: Mood normal.        Behavior: Behavior normal.     Lab Results  Component Value Date   WBC 6.6 09/01/2021   HGB 13.9 09/01/2021   HCT 42.3 09/01/2021   PLT 261  09/01/2021   GLUCOSE 93 12/11/2021   CHOL 175 12/11/2021   TRIG 95 12/11/2021   HDL 57 12/11/2021   LDLCALC 101 (H) 12/11/2021   ALT 15 12/11/2021   AST 15 12/11/2021   NA 143 12/11/2021   K 4.4 12/11/2021   CL 99 12/11/2021   CREATININE 0.78 12/11/2021   BUN 14 12/11/2021   CO2 27 12/11/2021   TSH 1.840 09/01/2021   HGBA1C 5.8 (H) 12/11/2021     Assessment & Plan:   Problem List Items Addressed This Visit       Cardiovascular and Mediastinum   Hypertension - Primary    Well-controlled.  Stopping HCTZ and potassium as it does not seem to be needed at this time given that her blood pressure is well-controlled.  Continue amlodipine.      Relevant Medications   amLODipine (NORVASC) 10 MG tablet   pravastatin (PRAVACHOL) 20 MG tablet     Other   Hyperlipidemia    Continue pravastatin.      Relevant Medications   amLODipine (NORVASC) 10 MG tablet   pravastatin (PRAVACHOL) 20 MG tablet   Anxiety    Stable.  Continue Prozac.      Relevant Medications   FLUoxetine (PROZAC) 40 MG capsule    Meds ordered this encounter  Medications   amLODipine (NORVASC) 10 MG tablet    Sig: Take 1 tablet (10 mg total) by mouth at bedtime.    Dispense:  90 tablet    Refill:  3   FLUoxetine (PROZAC) 40 MG capsule    Sig: TAKE ONE CAPSULE (40MG  TOTAL) BY MOUTH DAILY    Dispense:  90 capsule    Refill:  3   pravastatin (PRAVACHOL) 20 MG tablet    Sig: Take 1 tablet (20 mg total) by mouth at bedtime.    Dispense:  90 tablet    Refill:  3    Follow-up:  6 months  [place]

## 2022-02-16 NOTE — Patient Instructions (Signed)
Consider discontinuation of HCTZ and potassium.  Medications refilled.  Follow up in 6 months.  Take care  Dr. Lacinda Axon

## 2022-02-16 NOTE — Assessment & Plan Note (Signed)
- 

## 2022-02-16 NOTE — Assessment & Plan Note (Signed)
Continue pravastatin 

## 2022-02-24 ENCOUNTER — Ambulatory Visit: Payer: BC Managed Care – PPO | Admitting: Adult Health

## 2022-02-24 ENCOUNTER — Encounter: Payer: Self-pay | Admitting: Adult Health

## 2022-02-24 ENCOUNTER — Ambulatory Visit: Payer: BC Managed Care – PPO | Admitting: Obstetrics & Gynecology

## 2022-02-24 VITALS — BP 131/83 | HR 93 | Ht 61.0 in | Wt 191.0 lb

## 2022-02-24 DIAGNOSIS — N950 Postmenopausal bleeding: Secondary | ICD-10-CM

## 2022-02-24 MED ORDER — NORETHINDRONE 0.35 MG PO TABS: 1.0000 | ORAL_TABLET | Freq: Every day | ORAL | 12 refills | Status: DC

## 2022-02-24 NOTE — Progress Notes (Signed)
  Subjective:     Patient ID: Gabrielle Little, female   DOB: October 04, 1965, 57 y.o.   MRN: [id number]  HPI Gabrielle Little is a 57 year old white female, married, PM, had PMB, and had negative biopsy, is on Micronor, and still having period every month.  Last pap was negative HPV and NILM  PCP is Dr Lacinda Axon  Review of Systems Still having period every month Reviewed past medical,surgical, social and family history. Reviewed medications and allergies.     Objective:   Physical Exam BP 131/83 (BP Location: Left Arm, Patient Position: Sitting, Cuff Size: Normal)   Pulse 93   Ht 5\' 1"  (1.549 m)   Wt 191 lb (86.6 kg)   LMP 01/29/2022 (Approximate)   BMI 36.09 kg/m     Skin warm and dry.  Lungs: clear to ausculation bilaterally. Cardiovascular: regular rate and rhythm.  Fall risk is low  Upstream - 02/24/22 1402       Pregnancy Intention Screening   Does the patient want to become pregnant in the next year? No    Does the patient's partner want to become pregnant in the next year? No    Would the patient like to discuss contraceptive options today? No      Contraception Wrap Up   Current Method Female Sterilization    End Method Female Sterilization    Contraception Counseling Provided No             Assessment:     1. PMB (postmenopausal bleeding) Had negative biopsy, on Micronor daily, still has period every month,first 3 days can be heavy, will continue Micronor for as long as bleeding Meds ordered this encounter  Medications   norethindrone (MICRONOR) 0.35 MG tablet    Sig: Take 1 tablet (0.35 mg total) by mouth daily.    Dispense:  28 tablet    Refill:  12    Order Specific Question:   Supervising Provider    Answer:   Florian Buff [[id number]]       Plan:     Return 12/13/22 for pap and physical

## 2022-03-02 NOTE — Progress Notes (Unsigned)
[facility] [address] [place]. [place] [address] [phone]       OFFICE FOLLOW UP NOTE  Ms. Gabrielle Little Date of Birth:  08-Jun-1965 Medical Record Number:  [id number]   Reason for visit: Initial CPAP follow-up   SUBJECTIVE:   CHIEF COMPLAINT:  No chief complaint on file.   HPI:   Gabrielle Little is a 57 y.o. female who is being followed for OSA on CPAP. Evaluated by Dr. Rexene Alberts on 06/03/2021 with prior diagnosis of OSA with completion of HST 5/22 which showed severe OSA with total AHI of 70.4/h and O2 nadir of 80%.  Recommend initiating AutoPap which was started on 6/9.  Initial CPAP compliance visit 08/27/2021 with compliance report showing greater than 4-hour usage at 73% and residual AHI 5.5.  Interval history:     Epworth Sleepiness Scale 4/24 (prior 11/24).          History provided for reference purposes only Consult visit 06/03/2021 Dr. Rexene Alberts:  Gabrielle Little is a 57 year old right-handed woman with an underlying medical history of hypertension, hyperlipidemia, anxiety, allergic rhinitis, and obesity, who was previously diagnosed with obstructive sleep apnea and placed on PAP therapy.  I reviewed your office note from 01/13/2021.  Prior sleep study results were reviewed today.  She had a split-night sleep study on 08/06/2012, study was conducted at [facility] and study was interpreted by Dr. Phillips Odor.  She had a baseline AHI of 51/h, O2 nadir 78% during REM sleep.  She was titrated on CPAP between 5 and 15 cm with an optimal pressure deemed at 13 cm.  She reports that she never actually got her own machine, she was using her father's machine as he was not using his machine at the time.  It was an older model and it was cumbersome to use.  She was not consistent with it and has not used CPAP in about 4 years by her estimate.  A compliance report is not available as she has not used CPAP in years.  She has an Epworth sleepiness score of 11 out of 24,  fatigue severity score is 17 out of 63.  Per husband, she snores loudly and she has witnessed apneas per sister's observation.  Her bedtime is around 10 PM and rise time around 4:45 AM.  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  She has caffeine in the form of tea, about 2 servings per day, no daily coffee, occasional soda.  She does not drink any alcohol.  She is a non-smoker.  They have 3 grown children, 1 dog in the household.  She does have a TV on in her bedroom at night and her husband puts it on a timer.  She denies recurrent morning headaches.  She has nocturia about 3 times per average night.  She has slowly gained weight over the past 8 years in the realm of 20 pounds.  Her father had sleep apnea and her brother has sleep apnea and is on a CPAP machine.   ROS:   14 system review of systems performed and negative with exception of those listed in HPI  PMH:  Past Medical History:  Diagnosis Date   Allergic rhinitis    Anxiety    Hyperlipidemia    Hypertension    Ovarian cyst 09/08/2021   09/08/21 will repeat US in about 8 weeks    Sleep apnea    Thickened endometrium 09/08/2021  09/08/21 will get endometrial biopsy    PSH:  Past Surgical History:  Procedure Laterality Date   CESAREAN SECTION     COLONOSCOPY WITH PROPOFOL N/A 04/15/2021   Procedure: COLONOSCOPY WITH PROPOFOL;  Surgeon: Rogene Houston, MD;  Location: [facility];  Service: Endoscopy;  Laterality: N/A;  10:00 - ASA I/II   DILATION AND CURETTAGE OF UTERUS     POLYPECTOMY  04/15/2021   Procedure: POLYPECTOMY;  Surgeon: Rogene Houston, MD;  Location: [facility];  Service: Endoscopy;;    Social History:  Social History   Socioeconomic History   Marital status: Married    Spouse name: Not on file   Number of children: Not on file   Years of education: Not on file   Highest education level: Not on file  Occupational History   Not on file  Tobacco Use   Smoking  status: Never   Smokeless tobacco: Never  Vaping Use   Vaping Use: Never used  Substance and Sexual Activity   Alcohol use: No   Drug use: No   Sexual activity: Yes    Birth control/protection: Surgical    Comment: tubal  Other Topics Concern   Not on file  Social History Narrative   Not on file   Social Determinants of Health   Financial Resource Strain: Low Risk  (05/12/2020)   Overall Financial Resource Strain (CARDIA)    Difficulty of Paying Living Expenses: Not very hard  Food Insecurity: No Food Insecurity (05/12/2020)   Hunger Vital Sign    Worried About Running Out of Food in the Last Year: Never true    Ran Out of Food in the Last Year: Never true  Transportation Needs: No Transportation Needs (05/12/2020)   PRAPARE - Hydrologist (Medical): No    Lack of Transportation (Non-Medical): No  Physical Activity: Insufficiently Active (05/12/2020)   Exercise Vital Sign    Days of Exercise per Week: 1 day    Minutes of Exercise per Session: 20 min  Stress: No Stress Concern Present (05/12/2020)   [place]    Feeling of Stress : Only a little  Social Connections: Socially Integrated (05/12/2020)   Social Connection and Isolation Panel [NHANES]    Frequency of Communication with Friends and Family: Three times a week    Frequency of Social Gatherings with Friends and Family: Twice a week    Attends Religious Services: More than 4 times per year    Active Member of [employer] or Organizations: Yes    Attends Archivist Meetings: 1 to 4 times per year    Marital Status: Married  Human resources officer Violence: Not At Risk (05/12/2020)   Humiliation, Afraid, Rape, and Kick questionnaire    Fear of Current or Ex-Partner: No    Emotionally Abused: No    Physically Abused: No    Sexually Abused: No    Family History:  Family History  Problem Relation Age of Onset   Cancer Paternal  Grandmother 32       uterine   Diabetes Paternal Grandmother    Cancer Maternal Grandmother 40       uterine   Hypertension Maternal Grandfather    Heart attack Father 100       first MI   Hypertension Father    Diabetes Father    Sleep apnea Father    Hypertension Mother    Osteoporosis Mother  Sleep apnea Brother    Sleep apnea Sister    Cancer Maternal Aunt        breast    Medications:   Current Outpatient Medications on File Prior to Visit  Medication Sig Dispense Refill   amLODipine (NORVASC) 10 MG tablet Take 1 tablet (10 mg total) by mouth at bedtime. 90 tablet 3   FLUoxetine (PROZAC) 40 MG capsule TAKE ONE CAPSULE (40MG  TOTAL) BY MOUTH DAILY 90 capsule 3   Lysine 500 MG TABS Take 500 mg by mouth daily.     Multiple Vitamin (MULTIVITAMIN WITH MINERALS) TABS tablet Take 1 tablet by mouth daily.     norethindrone (MICRONOR) 0.35 MG tablet Take 1 tablet (0.35 mg total) by mouth daily. 28 tablet 12   pravastatin (PRAVACHOL) 20 MG tablet Take 1 tablet (20 mg total) by mouth at bedtime. 90 tablet 3   No current facility-administered medications on file prior to visit.    Allergies:   Allergies  Allergen Reactions   Biaxin [Clarithromycin] Rash   Clindamycin/Lincomycin Rash    Patient had angioedema extensive rash 2 days after stopping clindamycin-probable allergy      OBJECTIVE:  Physical Exam  There were no vitals filed for this visit.  There is no height or weight on file to calculate BMI. No results found.  General: well developed, well nourished, very pleasant middle-age Caucasian female, seated, in no evident distress Head: head normocephalic and atraumatic.   Neck: supple with no carotid or supraclavicular bruits Cardiovascular: regular rate and rhythm, no murmurs Musculoskeletal: no deformity Skin:  no rash/petichiae Vascular:  Normal pulses all extremities   Neurologic Exam Mental Status: Awake and fully alert. Oriented to place and time. Recent  and remote memory intact. Attention span, concentration and fund of knowledge appropriate. Mood and affect appropriate.  Cranial Nerves: Pupils equal, briskly reactive to light. Extraocular movements full without nystagmus. Visual fields full to confrontation. Hearing intact. Facial sensation intact. Face, tongue, palate moves normally and symmetrically.  Motor: Normal bulk and tone. Normal strength in all tested extremity muscles Sensory.: intact to touch , pinprick , position and vibratory sensation.  Coordination: Rapid alternating movements normal in all extremities. Finger-to-nose and heel-to-shin performed accurately bilaterally. Gait and Station: Arises from chair without difficulty. Stance is normal. Gait demonstrates normal stride length and balance without use of AD. Tandem walk and heel toe without difficulty.  Reflexes: 1+ and symmetric. Toes downgoing.         ASSESSMENT: Gabrielle Little is a 57 y.o. year old female diagnosed with severe sleep apnea per HST 06/2021 and initiation of AutoPap 6/9.      PLAN:  OSA on CPAP : Residual AHI slightly elevated (AHI 5.5) and discussed importance of increasing nightly compliance and ensuring greater than 4 hours per night as well as discuss leaking concern with DME company to ensure proper fit.  Continue current AutoPap settings but will adjust at follow up visit if needed.  Continue to follow with DME company for any needed supplies or CPAP related concerns.    Follow up in 6 months or call earlier if needed   CC:  PCP: Coral Spikes, DO    I spent 24 minutes of face-to-face and non-face-to-face time with patient and husband.  This included previsit chart review, lab review, study review, order entry, electronic health record documentation, patient and husband education regarding sleep apnea and HST report with review and discussion of compliance report and answered all other questions to  patient's satisfaction   Ihor Austin,  AGNP-BC  [facility] [address] [place], [place] [address]  Phone [phone] Fax [phone] Note: This document was prepared with digital dictation and possible smart phrase technology. Any transcriptional errors that result from this process are unintentional.

## 2022-03-03 ENCOUNTER — Ambulatory Visit: Payer: BC Managed Care – PPO | Admitting: Adult Health

## 2022-03-08 ENCOUNTER — Encounter: Payer: Self-pay | Admitting: Family Medicine

## 2022-03-08 ENCOUNTER — Ambulatory Visit (INDEPENDENT_AMBULATORY_CARE_PROVIDER_SITE_OTHER): Payer: BC Managed Care – PPO | Admitting: Family Medicine

## 2022-03-08 VITALS — BP 127/81 | HR 81 | Temp 98.5°F | Ht 61.0 in | Wt 189.0 lb

## 2022-03-08 DIAGNOSIS — J988 Other specified respiratory disorders: Secondary | ICD-10-CM | POA: Insufficient documentation

## 2022-03-08 NOTE — Progress Notes (Signed)
Subjective:  Patient ID: Gabrielle Little, female    DOB: [birth year]  Age: 57 y.o. MRN: [id number]  CC: Chief Complaint  Patient presents with   body aches, chills, headaches    No covid teststing done, taking robitussin symptoms since friday   cough and nasal congestion    HPI:  57 year old female presents for evaluation of the above.   Patient reports that symptoms began on Friday. Started with a "tickle" in her throat. Has now developed body aches, chills, intermittent headaches, cough and congestion. Patient teaches part time.  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 Has been taking OTC Advil and Robitussin with improvement.   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   Anxiety 07/26/2014   Hypertension 07/20/2012   Hyperlipidemia 07/20/2012    Social Hx   Social History   Socioeconomic History   Marital status: Married    Spouse name: Not on file   Number of children: Not on file   Years of education: Not on file   Highest education level: Not on file  Occupational History   Not on file  Tobacco Use   Smoking status: Never   Smokeless tobacco: Never  Vaping Use   Vaping Use: Never used  Substance and Sexual Activity   Alcohol use: No   Drug use: No   Sexual activity: Yes    Birth control/protection: Surgical    Comment: tubal  Other Topics Concern   Not on file  Social History Narrative   Not on file   Social Determinants of Health   Financial Resource Strain: Low Risk  (05/12/2020)   Overall Financial Resource Strain (CARDIA)    Difficulty of Paying Living Expenses: Not very hard  Food Insecurity: No Food Insecurity (05/12/2020)   Hunger Vital Sign    Worried About Running Out of Food in the Last Year: Never true    Ran Out of Food in the Last Year: Never  true  Transportation Needs: No Transportation Needs (05/12/2020)   PRAPARE - Hydrologist (Medical): No    Lack of Transportation (Non-Medical): No  Physical Activity: Insufficiently Active (05/12/2020)   Exercise Vital Sign    Days of Exercise per Week: 1 day    Minutes of Exercise per Session: 20 min  Stress: No Stress Concern Present (05/12/2020)   [place]    Feeling of Stress : Only a little  Social Connections: Socially Integrated (05/12/2020)   Social Connection and Isolation Panel [NHANES]    Frequency of Communication with Friends and Family: Three times a week    Frequency of Social Gatherings with Friends and Family: Twice a week    Attends Religious Services: More than 4 times per year    Active Member of [employer] or Organizations: Yes    Attends Archivist Meetings: 1 to 4 times per year    Marital Status: Married    Review of Systems Per HPI  Objective:  BP 127/81   Pulse 81   Temp 98.5 F (36.9 C)   Ht 5\' 1"  (1.549 m)   Wt 189 lb (85.7 kg)   LMP 01/29/2022 (Approximate)   SpO2 97%   BMI 35.71 kg/m      03/08/2022  3:56 PM 02/24/2022    1:56 PM 02/16/2022    2:18 PM  BP/Weight  Systolic BP 161 096 045  Diastolic BP 81 83 82  Wt. (Lbs) 189 191 186  BMI 35.71 kg/m2 36.09 kg/m2 35.14 kg/m2    Physical Exam Vitals and nursing note reviewed.  Constitutional:      General: She is not in acute distress.    Appearance: Normal appearance.  HENT:     Head: Normocephalic and atraumatic.     Right Ear: Tympanic membrane normal.     Left Ear: Tympanic membrane normal.     Mouth/Throat:     Pharynx: Oropharynx is clear.  Eyes:     General:        Right eye: No discharge.        Left eye: No discharge.     Conjunctiva/sclera: Conjunctivae normal.  Cardiovascular:     Rate and Rhythm: Normal rate and regular rhythm.  Pulmonary:     Effort: Pulmonary effort  is normal.     Breath sounds: Normal breath sounds. No wheezing, rhonchi or rales.  Neurological:     Mental Status: She is alert.     Lab Results  Component Value Date   WBC 6.6 09/01/2021   HGB 13.9 09/01/2021   HCT 42.3 09/01/2021   PLT 261 09/01/2021   GLUCOSE 93 12/11/2021   CHOL 175 12/11/2021   TRIG 95 12/11/2021   HDL 57 12/11/2021   LDLCALC 101 (H) 12/11/2021   ALT 15 12/11/2021   AST 15 12/11/2021   NA 143 12/11/2021   K 4.4 12/11/2021   CL 99 12/11/2021   CREATININE 0.78 12/11/2021   BUN 14 12/11/2021   CO2 27 12/11/2021   TSH 1.840 09/01/2021   HGBA1C 5.8 (H) 12/11/2021     Assessment & Plan:   Problem List Items Addressed This Visit       Respiratory   Respiratory infection - Primary    Concern for COVID. Awaiting swab results. Offered symptomatic treatment while awaiting results and patient declined.  Rest and supportive care. Advised continued use of OTC Robitussin.      Relevant Orders   COVID-19, Flu A+B and RSV   [place] DO [place]

## 2022-03-08 NOTE — Assessment & Plan Note (Addendum)
Concern for COVID. Awaiting swab results. Offered symptomatic treatment while awaiting results and patient declined.  Rest and supportive care. Advised continued use of OTC Robitussin.

## 2022-03-08 NOTE — Patient Instructions (Signed)
Rest. Fluids.  No work until testing results return.  Take care  Dr. Lacinda Axon

## 2022-03-10 ENCOUNTER — Other Ambulatory Visit: Payer: Self-pay | Admitting: Family Medicine

## 2022-03-10 LAB — COVID-19, FLU A+B AND RSV
Influenza A, NAA: NOT DETECTED
Influenza B, NAA: NOT DETECTED
RSV, NAA: NOT DETECTED
SARS-CoV-2, NAA: DETECTED — AB

## 2022-03-10 MED ORDER — NIRMATRELVIR/RITONAVIR (PAXLOVID)TABLET: ORAL_TABLET | ORAL | 0 refills | Status: DC

## 2022-04-07 ENCOUNTER — Other Ambulatory Visit (HOSPITAL_COMMUNITY): Payer: Self-pay | Admitting: Family Medicine

## 2022-04-07 DIAGNOSIS — Z1231 Encounter for screening mammogram for malignant neoplasm of breast: Secondary | ICD-10-CM

## 2022-04-14 ENCOUNTER — Ambulatory Visit (HOSPITAL_COMMUNITY)
Admission: RE | Admit: 2022-04-14 | Discharge: 2022-04-14 | Disposition: A | Discharge status: Home / Self Care | Payer: BC Managed Care – PPO | Source: Ambulatory Visit | Attending: Family Medicine | Admitting: Family Medicine

## 2022-04-14 ENCOUNTER — Encounter (HOSPITAL_COMMUNITY): Payer: Self-pay | Admitting: Radiology

## 2022-04-14 DIAGNOSIS — Z1231 Encounter for screening mammogram for malignant neoplasm of breast: Secondary | ICD-10-CM

## 2022-05-28 ENCOUNTER — Ambulatory Visit
Admission: RE | Admit: 2022-05-28 | Discharge: 2022-05-28 | Disposition: A | Discharge status: Home / Self Care | Payer: BC Managed Care – PPO | Source: Ambulatory Visit | Attending: Nurse Practitioner | Admitting: Nurse Practitioner

## 2022-05-28 VITALS — BP 139/80 | HR 85 | Temp 98.1°F | Resp 17

## 2022-05-28 DIAGNOSIS — S50362A Insect bite (nonvenomous) of left elbow, initial encounter: Secondary | ICD-10-CM | POA: Diagnosis not present

## 2022-05-28 DIAGNOSIS — W57XXXA Bitten or stung by nonvenomous insect and other nonvenomous arthropods, initial encounter: Secondary | ICD-10-CM

## 2022-05-28 MED ORDER — DOXYCYCLINE HYCLATE 100 MG PO TABS: 100.0000 mg | ORAL_TABLET | Freq: Two times a day (BID) | ORAL | 0 refills | Status: AC

## 2022-05-28 NOTE — ED Provider Notes (Signed)
[facility]    CSN: [id number] Arrival date & time: 05/28/22  1256      History   Chief Complaint Chief Complaint  Patient presents with   Insect Bite    Tick bite - Entered by patient    HPI Gabrielle Little is a 57 y.o. female.   The history is provided by the patient.   The patient presents for complaints of generalized fatigue, headache, light sensitivity, and joint pain of the bilateral hands that started over the past 4 days.  Patient states last week, she removed a tick.  She is unsure of how long the tick was in place.  Patient denies fever, chills, chest pain, abdominal pain, nausea, vomiting, diarrhea, or swelling of her lymph nodes.  Patient states that she wanted to make sure that she was okay.  She reports that she did reach out to her PCPs office, who was unable to see her for the next 2 to 3 weeks.  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   Anxiety 07/26/2014   Hypertension 07/20/2012   Hyperlipidemia 07/20/2012    Past Surgical History:  Procedure Laterality Date   CESAREAN SECTION     COLONOSCOPY WITH PROPOFOL N/A 04/15/2021   Procedure: COLONOSCOPY WITH PROPOFOL;  Surgeon: Malissa Hippo, MD;  Location: [facility];  Service: Endoscopy;  Laterality: N/A;  10:00 - ASA I/II   DILATION AND CURETTAGE OF UTERUS     POLYPECTOMY  04/15/2021   Procedure: POLYPECTOMY;  Surgeon: Malissa Hippo, MD;  Location: [facility];  Service: Endoscopy;;    OB History     Gravida  4   Para  3   Term  2    Preterm  1   AB  1   Living  3      SAB  1   IAB      Ectopic      Multiple      Live Births  3            Home Medications    Prior to Admission medications   Medication Sig Start Date End Date Taking? Authorizing Provider  amLODipine (NORVASC) 10 MG tablet Take 1 tablet (10 mg total) by mouth at bedtime. 02/16/22  Yes Cook, Jayce G, DO  doxycycline (VIBRA-TABS) 100 MG tablet Take 1 tablet (100 mg total) by mouth 2 (two) times daily for 5 days. 05/28/22 06/02/22 Yes Flynn Gwyn-Warren, Sadie Haber, NP  FLUoxetine (PROZAC) 40 MG capsule TAKE ONE CAPSULE (40MG  TOTAL) BY MOUTH DAILY 02/16/22  Yes Cook, Jayce G, DO  Lysine 500 MG TABS Take 500 mg by mouth daily.   Yes [provider]  Multiple Vitamin (MULTIVITAMIN WITH MINERALS) TABS tablet Take 1 tablet by mouth daily.   Yes [provider]  norethindrone (MICRONOR) 0.35 MG tablet Take 1 tablet (0.35 mg total) by mouth daily. 02/24/22  Yes Adline Potter, NP  pravastatin (PRAVACHOL) 20 MG tablet Take  1 tablet (20 mg total) by mouth at bedtime. 02/16/22  Yes Cook, Jayce G, DO  nirmatrelvir/ritonavir (PAXLOVID) 20 x 150 MG & 10 x 100MG  TABS Take nirmatrelvir 150 mg two tablets twice daily for 5 days and ritonavir 100 mg one tablet twice daily for 5 days. Patient GFR is 89. 03/10/22   Tommie Sams, DO    Family History Family History  Problem Relation Age of Onset   Cancer Paternal Grandmother 23       uterine   Diabetes Paternal Grandmother    Cancer Maternal Grandmother 80       uterine   Hypertension Maternal Grandfather    Heart attack Father 51       first MI   Hypertension Father    Diabetes Father    Sleep apnea Father    Hypertension Mother    Osteoporosis Mother    Sleep apnea Brother    Sleep apnea Sister    Cancer Maternal Aunt        breast    Social History Social History   Tobacco Use   Smoking status: Never   Smokeless tobacco: Never  Vaping Use   Vaping Use: Never used   Substance Use Topics   Alcohol use: No   Drug use: No     Allergies   Biaxin [clarithromycin] and Clindamycin/lincomycin   Review of Systems Review of Systems Per HPI  Physical Exam Triage Vital Signs ED Triage Vitals [05/28/22 1326]  Enc Vitals Group     BP 139/80     Pulse Rate 85     Resp 17     Temp 98.1 F (36.7 C)     Temp Source Oral     SpO2 97 %     Weight      Height      Head Circumference      Peak Flow      Pain Score 0     Pain Loc      Pain Edu?      Excl. in GC?    No data found.  Updated Vital Signs BP 139/80 (BP Location: Right Arm)   Pulse 85   Temp 98.1 F (36.7 C) (Oral)   Resp 17   LMP 01/29/2022 (Approximate)   SpO2 97%   Visual Acuity Right Eye Distance:   Left Eye Distance:   Bilateral Distance:    Right Eye Near:   Left Eye Near:    Bilateral Near:     Physical Exam Vitals and nursing note reviewed.  Constitutional:      General: She is not in acute distress.    Appearance: Normal appearance.  HENT:     Head: Normocephalic.     Right Ear: Tympanic membrane, ear canal and external ear normal.     Left Ear: Tympanic membrane, ear canal and external ear normal.     Nose: Nose normal.  Eyes:     General: Lids are normal.     Extraocular Movements: Extraocular movements intact.     Conjunctiva/sclera: Conjunctivae normal.     Pupils: Pupils are equal, round, and reactive to light.  Cardiovascular:     Rate and Rhythm: Normal rate and regular rhythm.     Pulses: Normal pulses.     Heart sounds: Normal heart sounds.  Pulmonary:     Effort: Pulmonary effort is normal. No respiratory distress.     Breath sounds: Normal breath sounds. No stridor. No wheezing, rhonchi or rales.  Abdominal:     General: Bowel sounds are normal.     Palpations: Abdomen is soft.     Tenderness: There is no abdominal tenderness.  Musculoskeletal:     Cervical back: Normal range of motion.  Lymphadenopathy:     Cervical: No cervical  adenopathy.  Skin:    General: Skin is warm and dry.  Neurological:     General: No focal deficit present.     Mental Status: She is alert and oriented to person, place, and time.  Psychiatric:        Mood and Affect: Mood normal.        Behavior: Behavior normal.      UC Treatments / Results  Labs (all labs ordered are listed, but only abnormal results are displayed) Labs Reviewed - No data to display  EKG   Radiology No results found.  Procedures Procedures (including critical care time)  Medications Ordered in UC Medications - No data to display  Initial Impression / Assessment and Plan / UC Course  I have reviewed the triage vital signs and the nursing notes.  Pertinent labs & imaging results that were available during my care of the patient were reviewed by me and considered in my medical decision making (see chart for details).  The patient is well-appearing, she is in no acute distress, vital signs are stable.  Patient presents for complaints of headache, generalized fatigue, more so than usual, joint stiffness, and light sensitivity.  She does have a recent report of a tick bite.  Do not suspect [facility] spotted fever or Lyme disease, but will treat empirically as patient has several symptoms.  Will treat patient with doxycycline 100 mg twice daily for the next 5 days.  Supportive care recommendations were provided and discussed with the patient to include increasing fluids, allowing for plenty of rest, and use of over-the-counter analgesics for pain or discomfort.  Patient advised to continue to monitor for rash consistent with erythema migrans.  Patient was given strict ER follow-up precautions.  Patient is in agreement with this plan of care and verbalizes understanding.  All questions were answered.  Patient stable for discharge.   Final Clinical Impressions(s) / UC Diagnoses   Final diagnoses:  Tick bite of left elbow, initial encounter     Discharge  Instructions      Take medication as prescribed. Increase fluids and allow for plenty of rest. May take over-the-counter Tylenol or ibuprofen as needed for pain, fever, or general discomfort. Continue to monitor the area of the bite for rash.  If the rash appears to look like a bull's-eye, or begins to spread, you will need to seek treatment immediately.  Also if you develop worsening fatigue, fever, swollen lymph nodes, despite taking the medication, please go to the emergency department immediately for further evaluation. Follow-up as needed.     ED Prescriptions     Medication Sig Dispense Auth. Provider   doxycycline (VIBRA-TABS) 100 MG tablet Take 1 tablet (100 mg total) by mouth 2 (two) times daily for 5 days. 10 tablet Doneta Bayman-Warren, Sadie Haber, NP      PDMP not reviewed this encounter.   Abran Cantor, NP 05/28/22 1345

## 2022-05-28 NOTE — ED Triage Notes (Signed)
Pt states she found a tick bite on her left elbow a week ago, now having a headache, fatigue, light sensitivity that started Monday.

## 2022-05-28 NOTE — Discharge Instructions (Signed)
Take medication as prescribed. Increase fluids and allow for plenty of rest. May take over-the-counter Tylenol or ibuprofen as needed for pain, fever, or general discomfort. Continue to monitor the area of the bite for rash.  If the rash appears to look like a bull's-eye, or begins to spread, you will need to seek treatment immediately.  Also if you develop worsening fatigue, fever, swollen lymph nodes, despite taking the medication, please go to the emergency department immediately for further evaluation. Follow-up as needed.

## 2022-08-18 ENCOUNTER — Ambulatory Visit: Payer: BC Managed Care – PPO | Admitting: Family Medicine

## 2022-08-18 ENCOUNTER — Encounter: Payer: Self-pay | Admitting: Family Medicine

## 2022-08-18 VITALS — BP 136/74 | HR 88 | Temp 98.0°F | Ht 61.0 in | Wt 193.0 lb

## 2022-08-18 DIAGNOSIS — R7303 Prediabetes: Secondary | ICD-10-CM

## 2022-08-18 DIAGNOSIS — E785 Hyperlipidemia, unspecified: Secondary | ICD-10-CM | POA: Diagnosis not present

## 2022-08-18 DIAGNOSIS — I10 Essential (primary) hypertension: Secondary | ICD-10-CM

## 2022-08-18 DIAGNOSIS — E669 Obesity, unspecified: Secondary | ICD-10-CM

## 2022-08-18 DIAGNOSIS — Z130 Encounter for screening for diseases of the blood and blood-forming organs and certain disorders involving the immune mechanism: Secondary | ICD-10-CM

## 2022-08-18 DIAGNOSIS — F419 Anxiety disorder, unspecified: Secondary | ICD-10-CM

## 2022-08-18 NOTE — Patient Instructions (Signed)
Labs ordered.  Follow up in 6 months.  Take care  Dr. Cook  

## 2022-08-19 DIAGNOSIS — E669 Obesity, unspecified: Secondary | ICD-10-CM | POA: Insufficient documentation

## 2022-08-19 MED ORDER — PRAVASTATIN SODIUM 20 MG PO TABS
20.0000 mg | ORAL_TABLET | Freq: Every day | ORAL | 3 refills | Status: DC
Start: 2022-08-19 — End: 2023-10-28

## 2022-08-19 MED ORDER — FLUOXETINE HCL 40 MG PO CAPS: ORAL_CAPSULE | ORAL | 3 refills | Status: DC

## 2022-08-19 MED ORDER — AMLODIPINE BESYLATE 10 MG PO TABS: 10.0000 mg | ORAL_TABLET | Freq: Every day | ORAL | 3 refills | Status: DC

## 2022-08-19 NOTE — Assessment & Plan Note (Signed)
Lipid panel today to assess.  Continue pravastatin.

## 2022-08-19 NOTE — Assessment & Plan Note (Signed)
Lengthy discussion today regarding better dietary choices and calorie deficit.  Advised regular physical activity as well.

## 2022-08-19 NOTE — Assessment & Plan Note (Signed)
Stable on Prozac.  Continue.

## 2022-08-19 NOTE — Progress Notes (Signed)
Subjective:  Patient ID: Gabrielle Little, female    DOB: 01-Mar-1965  Age: 57 y.o. MRN: [id number]  CC: Chief Complaint  Patient presents with   6 month follow up     No concerns voiced    HPI:  57 year old female with hypertension, OSA, prediabetes, hyperlipidemia, anxiety presents for follow-up.  Hypertension is stable on amlodipine 10 mg daily.  Anxiety stable on Prozac.  Patient needs her routine lab work today.  She remains on pravastatin regarding hyperlipidemia.  Preventative healthcare items up-to-date.  Patient states that her only concern is her weight.  She would like to lose some weight.  Will discuss dietary changes today.  Patient Active Problem List   Diagnosis Date Noted   Obesity (BMI 30-39.9) 08/19/2022   Prediabetes 02/16/2022   OSA (obstructive sleep apnea) 01/14/2021   High risk HPV infection 02/05/2016   Anxiety 07/26/2014   Hypertension 07/20/2012   Hyperlipidemia 07/20/2012    Social Hx   Social History   Socioeconomic History   Marital status: Married    Spouse name: Not on file   Number of children: Not on file   Years of education: Not on file   Highest education level: Not on file  Occupational History   Not on file  Tobacco Use   Smoking status: Never   Smokeless tobacco: Never  Vaping Use   Vaping status: Never Used  Substance and Sexual Activity   Alcohol use: No   Drug use: No   Sexual activity: Yes    Birth control/protection: Surgical    Comment: tubal  Other Topics Concern   Not on file  Social History Narrative   Not on file   Social Determinants of Health   Financial Resource Strain: Low Risk  (05/12/2020)   Overall Financial Resource Strain (CARDIA)    Difficulty of Paying Living Expenses: Not very hard  Food Insecurity: No Food Insecurity (05/12/2020)   Hunger Vital Sign    Worried About Running Out of Food in the Last Year: Never true    Ran Out of Food in the Last Year: Never true  Transportation Needs: No  Transportation Needs (05/12/2020)   PRAPARE - Administrator, Civil Service (Medical): No    Lack of Transportation (Non-Medical): No  Physical Activity: Insufficiently Active (05/12/2020)   Exercise Vital Sign    Days of Exercise per Week: 1 day    Minutes of Exercise per Session: 20 min  Stress: No Stress Concern Present (05/12/2020)   [employer] of Occupational Health - Occupational Stress Questionnaire    Feeling of Stress : Only a little  Social Connections: Socially Integrated (05/12/2020)   Social Connection and Isolation Panel [NHANES]    Frequency of Communication with Friends and Family: Three times a week    Frequency of Social Gatherings with Friends and Family: Twice a week    Attends Religious Services: More than 4 times per year    Active Member of [employer] or Organizations: Yes    Attends Banker Meetings: 1 to 4 times per year    Marital Status: Married    Review of Systems  Constitutional: Negative.   Respiratory: Negative.    Cardiovascular: Negative.      Objective:  BP 136/74   Pulse 88   Temp 98 F (36.7 C)   Ht 5\' 1"  (1.549 m)   Wt 193 lb (87.5 kg)   LMP 01/29/2022 (Approximate)   SpO2 97%   BMI 36.47  kg/m      08/18/2022    9:27 AM 05/28/2022    1:26 PM 03/08/2022    3:56 PM  BP/Weight  Systolic BP 136 139 127  Diastolic BP 74 80 81  Wt. (Lbs) 193  189  BMI 36.47 kg/m2  35.71 kg/m2    Physical Exam Vitals and nursing note reviewed.  Constitutional:      General: She is not in acute distress.    Appearance: Normal appearance. She is obese.  HENT:     Head: Normocephalic and atraumatic.  Eyes:     General:        Right eye: No discharge.        Left eye: No discharge.     Conjunctiva/sclera: Conjunctivae normal.  Cardiovascular:     Rate and Rhythm: Normal rate and regular rhythm.  Pulmonary:     Effort: Pulmonary effort is normal.     Breath sounds: Normal breath sounds. No wheezing, rhonchi or rales.   Neurological:     Mental Status: She is alert.  Psychiatric:        Mood and Affect: Mood normal.        Behavior: Behavior normal.     Lab Results  Component Value Date   WBC 6.6 09/01/2021   HGB 13.9 09/01/2021   HCT 42.3 09/01/2021   PLT 261 09/01/2021   GLUCOSE 93 12/11/2021   CHOL 175 12/11/2021   TRIG 95 12/11/2021   HDL 57 12/11/2021   LDLCALC 101 (H) 12/11/2021   ALT 15 12/11/2021   AST 15 12/11/2021   NA 143 12/11/2021   K 4.4 12/11/2021   CL 99 12/11/2021   CREATININE 0.78 12/11/2021   BUN 14 12/11/2021   CO2 27 12/11/2021   TSH 1.840 09/01/2021   HGBA1C 5.8 (H) 12/11/2021     Assessment & Plan:   Problem List Items Addressed This Visit       Cardiovascular and Mediastinum   Hypertension - Primary    Stable.  Continue amlodipine.      Relevant Medications   amLODipine (NORVASC) 10 MG tablet   pravastatin (PRAVACHOL) 20 MG tablet   Other Relevant Orders   CMP14+EGFR   Microalbumin / creatinine urine ratio     Other   Prediabetes   Relevant Orders   Hemoglobin A1c   Obesity (BMI 30-39.9)    Lengthy discussion today regarding better dietary choices and calorie deficit.  Advised regular physical activity as well.      Hyperlipidemia    Lipid panel today to assess.  Continue pravastatin.      Relevant Medications   amLODipine (NORVASC) 10 MG tablet   pravastatin (PRAVACHOL) 20 MG tablet   Other Relevant Orders   Lipid panel   Anxiety    Stable on Prozac.  Continue.      Relevant Medications   FLUoxetine (PROZAC) 40 MG capsule   Other Visit Diagnoses     Screening for deficiency anemia       Relevant Orders   CBC       Meds ordered this encounter  Medications   amLODipine (NORVASC) 10 MG tablet    Sig: Take 1 tablet (10 mg total) by mouth at bedtime.    Dispense:  90 tablet    Refill:  3   FLUoxetine (PROZAC) 40 MG capsule    Sig: TAKE ONE CAPSULE (40MG  TOTAL) BY MOUTH DAILY    Dispense:  90 capsule    Refill:  3  pravastatin (PRAVACHOL) 20 MG tablet    Sig: Take 1 tablet (20 mg total) by mouth at bedtime.    Dispense:  90 tablet    Refill:  3    Follow-up:  Return in about 6 months (around 02/18/2023).  Everlene Other DO [facility]

## 2022-08-19 NOTE — Assessment & Plan Note (Signed)
Stable. °-Continue amlodipine °

## 2022-09-06 ENCOUNTER — Ambulatory Visit: Payer: BC Managed Care – PPO | Admitting: Adult Health

## 2022-11-11 ENCOUNTER — Ambulatory Visit: Payer: BC Managed Care – PPO | Admitting: Nurse Practitioner

## 2022-11-11 VITALS — BP 134/81 | HR 84 | Temp 97.9°F | Ht 60.0 in | Wt 201.8 lb

## 2022-11-11 DIAGNOSIS — R300 Dysuria: Secondary | ICD-10-CM

## 2022-11-11 MED ORDER — CEFDINIR 300 MG PO CAPS: 300.0000 mg | ORAL_CAPSULE | Freq: Two times a day (BID) | ORAL | 0 refills | Status: DC

## 2022-11-12 ENCOUNTER — Encounter: Payer: Self-pay | Admitting: Nurse Practitioner

## 2022-11-12 NOTE — Progress Notes (Signed)
   Subjective:    Patient ID: Gabrielle Little, female    DOB: [birth year], 57 y.o.   MRN: [id number]  HPI Presents with complaints of urinary symptoms that began 3 days ago.  Started as pressure and pain especially at the end of urination.  After voiding still felt the need to urinate.  Some mild achiness in the mid pelvic area.  Some urgency and frequency.  No fever.  Low back pain mainly on the left side, no flank or mid back pain.  No history of recent UTI.  Has tried some AZO which has helped.  No new sexual partners.  Denies concerns about STDs.  Taking fluids well.   Review of Systems  Constitutional:  Negative for fever.  Respiratory:  Negative for cough, chest tightness and shortness of breath.   Cardiovascular:  Negative for chest pain.  Genitourinary:  Positive for dysuria, frequency and urgency. Negative for flank pain.       Objective:   Physical Exam NAD.  Alert, oriented.  Lungs clear.  Heart regular rate rhythm.  Mild tenderness with percussion of the right CVA area, none on the left.  Abdomen soft nondistended with mild suprapubic pressure on exam.  No rebound or guarding.  Unable to do a urine dipstick/UA due to use of AZO. Today's Vitals   11/11/22 1305  BP: 134/81  Pulse: 84  Temp: 97.9 F (36.6 C)  SpO2: 97%  Weight: 201 lb 12.8 oz (91.5 kg)  Height: 5' (1.524 m)   Body mass index is 39.41 kg/m.        Assessment & Plan:  Dysuria - Plan: Urine Culture Based on current symptoms, we will go ahead and treat with antibiotics.  Increase clear fluid intake.  May use AZO for the next 48 hours then discontinue.  Warning signs reviewed.  Patient to go to ED or urgent care over the weekend if worse.  Call back next week if no improvement.  Further follow-up based on culture report.

## 2022-11-15 LAB — URINE CULTURE

## 2022-11-15 LAB — SPECIMEN STATUS REPORT

## 2022-11-24 ENCOUNTER — Encounter: Payer: Self-pay | Admitting: Family Medicine

## 2022-11-24 ENCOUNTER — Ambulatory Visit: Payer: BC Managed Care – PPO | Admitting: Family Medicine

## 2022-11-24 ENCOUNTER — Ambulatory Visit (HOSPITAL_COMMUNITY)
Admission: RE | Admit: 2022-11-24 | Discharge: 2022-11-24 | Disposition: A | Discharge status: Home / Self Care | Payer: BC Managed Care – PPO | Source: Ambulatory Visit | Attending: Family Medicine | Admitting: Family Medicine

## 2022-11-24 ENCOUNTER — Other Ambulatory Visit (HOSPITAL_COMMUNITY)
Admission: RE | Admit: 2022-11-24 | Discharge: 2022-11-24 | Disposition: A | Discharge status: Home / Self Care | Payer: BC Managed Care – PPO | Source: Ambulatory Visit | Attending: Family Medicine | Admitting: Family Medicine

## 2022-11-24 ENCOUNTER — Other Ambulatory Visit: Payer: Self-pay | Admitting: Family Medicine

## 2022-11-24 ENCOUNTER — Other Ambulatory Visit: Payer: Self-pay

## 2022-11-24 VITALS — BP 149/80 | HR 90 | Temp 98.6°F | Wt 200.8 lb

## 2022-11-24 DIAGNOSIS — R0781 Pleurodynia: Secondary | ICD-10-CM

## 2022-11-24 DIAGNOSIS — R051 Acute cough: Secondary | ICD-10-CM

## 2022-11-24 DIAGNOSIS — R7989 Other specified abnormal findings of blood chemistry: Secondary | ICD-10-CM

## 2022-11-24 LAB — CBC WITH DIFFERENTIAL/PLATELET
Abs Immature Granulocytes: 0.02 10*3/uL (ref 0.00–0.07)
Basophils Absolute: 0.1 10*3/uL (ref 0.0–0.1)
Basophils Relative: 1 %
Eosinophils Absolute: 0.2 10*3/uL (ref 0.0–0.5)
Eosinophils Relative: 2 %
HCT: 42.6 % (ref 36.0–46.0)
Hemoglobin: 13.6 g/dL (ref 12.0–15.0)
Immature Granulocytes: 0 %
Lymphocytes Relative: 25 %
Lymphs Abs: 1.8 10*3/uL (ref 0.7–4.0)
MCH: 28 pg (ref 26.0–34.0)
MCHC: 31.9 g/dL (ref 30.0–36.0)
MCV: 87.8 fL (ref 80.0–100.0)
Monocytes Absolute: 0.7 10*3/uL (ref 0.1–1.0)
Monocytes Relative: 10 %
Neutro Abs: 4.6 10*3/uL (ref 1.7–7.7)
Neutrophils Relative %: 62 %
Platelets: 262 10*3/uL (ref 150–400)
RBC: 4.85 MIL/uL (ref 3.87–5.11)
RDW: 14 % (ref 11.5–15.5)
WBC: 7.3 10*3/uL (ref 4.0–10.5)
nRBC: 0 % (ref 0.0–0.2)

## 2022-11-24 LAB — BASIC METABOLIC PANEL
Anion gap: 10 (ref 5–15)
BUN: 10 mg/dL (ref 6–20)
CO2: 29 mmol/L (ref 22–32)
Calcium: 8.7 mg/dL — ABNORMAL LOW (ref 8.9–10.3)
Chloride: 102 mmol/L (ref 98–111)
Creatinine, Ser: 0.72 mg/dL (ref 0.44–1.00)
GFR, Estimated: >60 mL/min (ref >60)
Glucose, Bld: 113 mg/dL — ABNORMAL HIGH (ref 70–99)
Potassium: 3.7 mmol/L (ref 3.5–5.1)
Sodium: 141 mmol/L (ref 135–145)

## 2022-11-24 LAB — D-DIMER, QUANTITATIVE: D-Dimer, Quant: 0.58 ug{FEU}/mL — ABNORMAL HIGH (ref 0.00–0.50)

## 2022-11-24 MED ORDER — HYDROCODONE-ACETAMINOPHEN 5-325 MG PO TABS: 1.0000 | ORAL_TABLET | Freq: Four times a day (QID) | ORAL | 0 refills | Status: AC | PRN

## 2022-11-24 MED ORDER — IOHEXOL 350 MG/ML SOLN
75.0000 mL | Freq: Once | INTRAVENOUS | Status: AC | PRN
  Administered 2022-11-24: 75 mL via INTRAVENOUS

## 2022-11-24 MED ORDER — HYDROCODONE-ACETAMINOPHEN 5-325 MG PO TABS: 1.0000 | ORAL_TABLET | Freq: Four times a day (QID) | ORAL | 0 refills | Status: DC | PRN

## 2022-11-24 NOTE — Progress Notes (Signed)
   Subjective:    Patient ID: Gabrielle Little, female    DOB: [birth year], 57 y.o.   MRN: [id number]  HPI She has had about 3 weeks of some drainage and coughing that has become more progressive over the past few weeks and now to the point where when she coughs she feels a sharp pain in her right lower lung which causes her to have a difficult time taking a deep breath she denies feeling short of breath but she states the pain is severe with each cough and with deep breathing.  She denies fever chills sweats vomiting   Review of Systems     Objective:   Physical Exam Gen-NAD not toxic  Chest-CTA respiratory rate normal no crackles she does have subjective pain in the right base of the lung no tachypnea CV RRR no murmur Skin-warm dry Neuro-grossly normal  Patient does not appear toxic      Assessment & Plan:   More than likely pulled muscles in the intercostal muscles Likelihood of blood clot is low but because of progressive nature over the past few weeks and worsening symptoms will need chest x-ray D-dimer CBC met 7  CBC metabolic 7 look good D-dimer is elevated Chest x-ray nonspecific   Given her pleuritic chest pain present over the past couple weeks along with respiratory symptoms and also elevated D-dimer patient will need CT scan pulmonary embolism protocol to rule out pulmonary embolism

## 2022-12-13 ENCOUNTER — Other Ambulatory Visit (HOSPITAL_COMMUNITY)
Admission: RE | Admit: 2022-12-13 | Discharge: 2022-12-13 | Disposition: A | Discharge status: Home / Self Care | Payer: BC Managed Care – PPO | Source: Ambulatory Visit | Attending: Adult Health | Admitting: Adult Health

## 2022-12-13 ENCOUNTER — Ambulatory Visit (INDEPENDENT_AMBULATORY_CARE_PROVIDER_SITE_OTHER): Payer: BC Managed Care – PPO | Admitting: Adult Health

## 2022-12-13 ENCOUNTER — Encounter: Payer: Self-pay | Admitting: Adult Health

## 2022-12-13 VITALS — BP 124/71 | HR 79 | Ht 61.0 in | Wt 200.6 lb

## 2022-12-13 DIAGNOSIS — Z1211 Encounter for screening for malignant neoplasm of colon: Secondary | ICD-10-CM | POA: Diagnosis not present

## 2022-12-13 DIAGNOSIS — Z01419 Encounter for gynecological examination (general) (routine) without abnormal findings: Secondary | ICD-10-CM | POA: Diagnosis present

## 2022-12-13 DIAGNOSIS — Z8742 Personal history of other diseases of the female genital tract: Secondary | ICD-10-CM | POA: Diagnosis not present

## 2022-12-13 DIAGNOSIS — Z1331 Encounter for screening for depression: Secondary | ICD-10-CM | POA: Diagnosis not present

## 2022-12-13 HISTORY — DX: Encounter for screening for malignant neoplasm of colon: Z12.11

## 2022-12-13 HISTORY — DX: Encounter for gynecological examination (general) (routine) without abnormal findings: Z01.419

## 2022-12-13 LAB — HEMOCCULT GUIAC POC 1CARD (OFFICE): Fecal Occult Blood, POC: NEGATIVE

## 2022-12-13 NOTE — Progress Notes (Signed)
Patient ID: [name], female   DOB: Sep 23, 1965, 57 y.o.   MRN: [id number] History of Present Illness: Gabrielle Little is a 57 year old white female,married, PM in for a well woman gyn exam and pap. She denies any vaginal bleeding. She is on Micronor.  She does have fractured rib on right, from coughing she says. Has on mask today, has tickle in throat since yesterday, ? Due to leaves being mulched up at home on Saturday.   PCP is Dr Gabrielle Little.    Current Medications, Allergies, Past Medical History, Past Surgical History, Family History and Social History were reviewed in [employer] record.     Review of Systems: Patient denies any headaches, hearing loss, fatigue, blurred vision, shortness of breath, chest pain, abdominal pain, problems with bowel movements, urination, or intercourse. No joint pain or mood swings.  Denies any vaginal bleeding   Physical Exam:BP 124/71 (BP Location: Right Arm, Patient Position: Sitting, Cuff Size: Normal)   Pulse 79   Ht 5\' 1"  (1.549 m)   Wt 200 lb 9.6 oz (91 kg)   LMP 01/29/2022 (Approximate)   BMI 37.90 kg/m   General:  Well developed, well nourished, no acute distress Skin:  Warm and dry Neck:  Midline trachea, normal thyroid, good ROM, no lymphadenopathy Lungs; Clear to auscultation bilaterally Breast:  No dominant palpable mass, retraction, or nipple discharge Cardiovascular: Regular rate and rhythm Abdomen:  Soft, non tender, no hepatosplenomegaly Pelvic:  External genitalia is normal in appearance, no lesions.  The vagina is pale. Urethra has no lesions or masses. The cervix is smooth, pap with HR HPV genotyping performed.  Uterus is felt to be normal size, shape, and contour.  No adnexal masses or tenderness noted.Bladder is non tender, no masses felt. Rectal: Good sphincter tone, no polyps, or hemorrhoids felt.  Hemoccult negative. Extremities/musculoskeletal:  No swelling or varicosities noted, no clubbing or cyanosis Psych:   No mood changes, alert and cooperative,seems happy AA is 0 Fall risk is low    12/13/2022   11:02 AM 11/11/2022    1:19 PM 08/18/2022    9:32 AM  Depression screen PHQ 2/9  Decreased Interest 0 1 0  Down, Depressed, Hopeless 0 0 0  PHQ - 2 Score 0 1 0  Altered sleeping 1 1 1   Tired, decreased energy 1 1 1   Change in appetite 0 0 0  Feeling bad or failure about yourself  0 0 0  Trouble concentrating 0 0 0  Moving slowly or fidgety/restless 0 0 0  Suicidal thoughts 0 0 0  PHQ-9 Score 2 3 2   Difficult doing work/chores  Not difficult at all Not difficult at all       12/13/2022   11:04 AM 11/11/2022    1:19 PM 08/18/2022    9:32 AM 02/16/2022    2:22 PM  GAD 7 : Generalized Anxiety Score  Nervous, Anxious, on Edge 0 1 0 0  Control/stop worrying 0 0 0 0  Worry too much - different things 0 0 0 0  Trouble relaxing 0 0 0 0  Restless 0 0 0 0  Easily annoyed or irritable 1 1 1 1   Afraid - awful might happen 0 0 0 0  Total GAD 7 Score 1 2 1 1   Anxiety Difficulty  Not difficult at all Not difficult at all Not difficult at all    Upstream - 12/13/22 1105       Pregnancy Intention Screening   Does  the patient want to become pregnant in the next year? No    Does the patient's partner want to become pregnant in the next year? No    Would the patient like to discuss contraceptive options today? No      Contraception Wrap Up   Current Method Oral Contraceptive;Female Sterilization   PM   End Method Oral Contraceptive;Female Sterilization   PM   Contraception Counseling Provided No              Pt gave verbal consent for exam without chaperone.   Impression and plan:  1. Encounter for gynecological examination with Papanicolaou smear of cervix Pap sent Pap in 3 years if normal Physical in 1 year Mammogram was negative 04/14/22 Colonoscopy per GI Labs with PCP  - Cytology - PAP( [facility])  2. Encounter for screening fecal occult blood testing Hemoccult was  negative   3. History of ovarian cyst Resolved on [place] 11/24/21 Still taking Micronor No vaginal bleeding since February 2024  Follow up in 4 months for ROS

## 2022-12-16 LAB — CYTOLOGY - PAP
Adequacy: ABSENT
Comment: NEGATIVE
Diagnosis: NEGATIVE
High risk HPV: NEGATIVE

## 2022-12-24 ENCOUNTER — Other Ambulatory Visit: Payer: Self-pay | Admitting: Family Medicine

## 2022-12-24 MED ORDER — PROMETHAZINE-DM 6.25-15 MG/5ML PO SYRP: 5.0000 mL | ORAL_SOLUTION | Freq: Four times a day (QID) | ORAL | 0 refills | Status: DC | PRN

## 2023-02-18 ENCOUNTER — Ambulatory Visit: Payer: BC Managed Care – PPO | Admitting: Family Medicine

## 2023-03-09 ENCOUNTER — Telehealth: Payer: Self-pay

## 2023-03-09 NOTE — Telephone Encounter (Signed)
 Called pt and spoke with pt husband, offered appt with courtney on Friday as this is our earliest appt, pt and husband declined as they will be going out of town

## 2023-03-09 NOTE — Telephone Encounter (Signed)
 Communication  Reason for CRM: Patient called on behalf of his wife Zyia [place], [place] 08[birth year]. Says she is coming down with the same thing he was seen for last week on Monday the 27th and wants to know if Dr. Bluford can call in medication for her to the [facility] because she is having those same symptoms. I let patient know we would need to schedule apt but he wanted to send Dr. Bluford a message and see if something could be called in without her being seen.

## 2023-03-14 ENCOUNTER — Ambulatory Visit: Payer: Self-pay | Admitting: Family Medicine

## 2023-03-14 ENCOUNTER — Other Ambulatory Visit: Payer: Self-pay | Admitting: Adult Health

## 2023-03-30 ENCOUNTER — Ambulatory Visit: Payer: Self-pay | Admitting: Family Medicine

## 2023-04-08 ENCOUNTER — Ambulatory Visit: Payer: Self-pay | Admitting: Family Medicine

## 2023-04-29 ENCOUNTER — Ambulatory Visit: Payer: Self-pay | Admitting: Family Medicine

## 2023-05-03 ENCOUNTER — Encounter: Payer: Self-pay | Admitting: Nurse Practitioner

## 2023-05-03 ENCOUNTER — Ambulatory Visit (INDEPENDENT_AMBULATORY_CARE_PROVIDER_SITE_OTHER): Payer: Self-pay | Admitting: Nurse Practitioner

## 2023-05-03 VITALS — BP 132/68 | HR 89 | Temp 97.9°F | Ht 61.0 in | Wt 204.0 lb

## 2023-05-03 DIAGNOSIS — R7303 Prediabetes: Secondary | ICD-10-CM

## 2023-05-03 DIAGNOSIS — F419 Anxiety disorder, unspecified: Secondary | ICD-10-CM | POA: Diagnosis not present

## 2023-05-03 DIAGNOSIS — E669 Obesity, unspecified: Secondary | ICD-10-CM

## 2023-05-03 DIAGNOSIS — I10 Essential (primary) hypertension: Secondary | ICD-10-CM

## 2023-05-03 DIAGNOSIS — R5383 Other fatigue: Secondary | ICD-10-CM

## 2023-05-03 DIAGNOSIS — G4733 Obstructive sleep apnea (adult) (pediatric): Secondary | ICD-10-CM

## 2023-05-03 DIAGNOSIS — M67972 Unspecified disorder of synovium and tendon, left ankle and foot: Secondary | ICD-10-CM

## 2023-05-03 DIAGNOSIS — Z13220 Encounter for screening for lipoid disorders: Secondary | ICD-10-CM

## 2023-05-03 MED ORDER — HYDROCHLOROTHIAZIDE 25 MG PO TABS: 25.0000 mg | ORAL_TABLET | Freq: Every day | ORAL | 1 refills | Status: AC

## 2023-05-03 MED ORDER — PHENTERMINE HCL 37.5 MG PO TABS
ORAL_TABLET | ORAL | 0 refills | Status: DC
Start: 2023-05-03 — End: 2023-05-31

## 2023-05-03 NOTE — Progress Notes (Unsigned)
 Subjective:    Patient ID: Gabrielle Little, female    DOB: 07-Dec-1965, 58 y.o.   MRN: [id number]  HPI 6 month follow up  Pain on top of left foot 2 week intermittently Presents for 28-month recheck on her blood pressure.  Currently on amlodipine 10 mg daily with hydrochlorothiazide 25 mg daily as needed for edema, does not take this every day.  Has been experiencing slight edema in the lower extremities.  Denies any cough unusual shortness of breath or orthopnea.  Has been diagnosed with sleep apnea but cannot tolerate wearing her CPAP.  States her anxiety is under good control with fluoxetine.  Gets regular preventive health physicals with gynecology.  Is due for her routine labs.  Working 2 part-time jobs where she is on her feet a lot more.  Has had discomfort on the top of her left foot for the past couple weeks.  No history of injury.  Better when she is off of her feet.  Greatly improved after wearing a compression stocking.  Has been trying to eat as healthy as possible, states her and her husband eat protein and simple carbs.  Has been actively trying to lose weight.  Gets regular eye exams.  Review of Systems  Constitutional:  Positive for fatigue. Negative for activity change and appetite change.  HENT:  Negative for sore throat and trouble swallowing.   Respiratory:  Negative for cough, chest tightness, shortness of breath and wheezing.   Cardiovascular:  Positive for leg swelling. Negative for chest pain and palpitations.  Musculoskeletal:        Pain on top of the left foot.  Neurological:  Negative for facial asymmetry, speech difficulty, weakness and numbness.      05/03/2023    2:06 PM  Depression screen PHQ 2/9  Decreased Interest 0  Down, Depressed, Hopeless 1  PHQ - 2 Score 1  Altered sleeping 1  Tired, decreased energy 1  Change in appetite 0  Feeling bad or failure about yourself  0  Trouble concentrating 0  Moving slowly or fidgety/restless 0  Suicidal thoughts 0   PHQ-9 Score 3  Difficult doing work/chores Not difficult at all      05/03/2023    2:06 PM 12/13/2022   11:04 AM 11/11/2022    1:19 PM 08/18/2022    9:32 AM  GAD 7 : Generalized Anxiety Score  Nervous, Anxious, on Edge 1 0 1 0  Control/stop worrying 1 0 0 0  Worry too much - different things 0 0 0 0  Trouble relaxing 0 0 0 0  Restless 0 0 0 0  Easily annoyed or irritable 1 1 1 1   Afraid - awful might happen 0 0 0 0  Total GAD 7 Score 3 1 2 1   Anxiety Difficulty Not difficult at all  Not difficult at all Not difficult at all         Objective:   Physical Exam NAD.  Alert, oriented.  Calm cheerful affect.  Thyroid nontender to palpation, no mass or goiter noted.  Lungs clear.  Heart regular rate rhythm.  Lower extremities around the ankles 1+ pitting edema.  Left foot strong DP pulse.  Toes warm with normal capillary refill.  Sensation intact.  Mild tenderness with palpation of the mid dorsum of the foot.  No warmth, tenderness or nodularity noted.  Normal ROM of the toes without tenderness.  Gait normal limit.  Today's Vitals   05/03/23 1356  BP: 132/68  Pulse: 89  Temp: 97.9 F (36.6 C)  SpO2: 97%  Weight: 204 lb (92.5 kg)  Height: 5\' 1"  (1.549 m)   Body mass index is 38.55 kg/m.      Assessment & Plan:   Problem List Items Addressed This Visit       Cardiovascular and Mediastinum   Hypertension - Primary   Relevant Medications   hydrochlorothiazide (HYDRODIURIL) 25 MG tablet   Other Relevant Orders   Comprehensive metabolic panel with GFR   Lipid panel   Microalbumin / creatinine urine ratio     Respiratory   OSA (obstructive sleep apnea)     Musculoskeletal and Integument   Tendinopathy of left foot     Other   Anxiety   Obesity (BMI 30-39.9)   Relevant Medications   phentermine (ADIPEX-P) 37.5 MG tablet   Prediabetes   Relevant Orders   Comprehensive metabolic panel with GFR   Hemoglobin A1c   Lipid panel   Other Visit Diagnoses        Fatigue, unspecified type       Relevant Orders   CBC with Differential/Platelet   Comprehensive metabolic panel with GFR   T4, free   TSH     Screening for lipid disorders       Relevant Orders   Lipid panel      Meds ordered this encounter  Medications   hydrochlorothiazide (HYDRODIURIL) 25 MG tablet    Sig: Take 1 tablet (25 mg total) by mouth daily. Prn swelling    Dispense:  90 tablet    Refill:  1    Supervising Provider:   Lilyan Punt A [[id number]]   phentermine (ADIPEX-P) 37.5 MG tablet    Sig: Take one tab po qam for weight loss    Dispense:  30 tablet    Refill:  0    Supervising Provider:   Lilyan Punt A [[id number]]   Recommend starting hydrochlorothiazide daily for BP and edema.  Monitor BP outside the office. Continue compression stocking for her left foot especially when she is on her feet for prolonged periods of time.  Continue weight loss efforts.  Call back if persists. Patient defers referral to another provider to discuss her CPAP at this time. Continue fluoxetine as directed. Labs ordered. Discussed options for weight loss.  Trial of phentermine as directed as a jumpstart to her weight loss.  Reviewed potential adverse effects.  Patient understands this is a temporary measure to help get her weight loss efforts started.  Continue healthy diet.  She is getting some activity with her jobs but also recommend regular exercise. Return in about 4 weeks (around 05/31/2023).

## 2023-05-03 NOTE — Patient Instructions (Addendum)
Glycemic index

## 2023-05-05 ENCOUNTER — Encounter: Payer: Self-pay | Admitting: Nurse Practitioner

## 2023-05-05 DIAGNOSIS — M67972 Unspecified disorder of synovium and tendon, left ankle and foot: Secondary | ICD-10-CM | POA: Insufficient documentation

## 2023-05-22 LAB — HEMOGLOBIN A1C
Est. average glucose Bld gHb Est-mCnc: 126 mg/dL
Hgb A1c MFr Bld: 6 % — ABNORMAL HIGH (ref 4.8–5.6)

## 2023-05-22 LAB — CBC WITH DIFFERENTIAL/PLATELET
Basophils Absolute: 0.1 10*3/uL (ref 0.0–0.2)
Basos: 1 %
EOS (ABSOLUTE): 0.1 10*3/uL (ref 0.0–0.4)
Eos: 2 %
Hematocrit: 45.6 % (ref 34.0–46.6)
Hemoglobin: 15.1 g/dL (ref 11.1–15.9)
Immature Grans (Abs): 0 10*3/uL (ref 0.0–0.1)
Immature Granulocytes: 0 %
Lymphocytes Absolute: 2.4 10*3/uL (ref 0.7–3.1)
Lymphs: 32 %
MCH: 28.8 pg (ref 26.6–33.0)
MCHC: 33.1 g/dL (ref 31.5–35.7)
MCV: 87 fL (ref 79–97)
Monocytes Absolute: 0.7 10*3/uL (ref 0.1–0.9)
Monocytes: 9 %
Neutrophils Absolute: 4.3 10*3/uL (ref 1.4–7.0)
Neutrophils: 56 %
Platelets: 298 10*3/uL (ref 150–450)
RBC: 5.24 x10E6/uL (ref 3.77–5.28)
RDW: 13.9 % (ref 11.7–15.4)
WBC: 7.5 10*3/uL (ref 3.4–10.8)

## 2023-05-22 LAB — LIPID PANEL
Chol/HDL Ratio: 2.8 ratio (ref 0.0–4.4)
Cholesterol, Total: 159 mg/dL (ref 100–199)
HDL: 57 mg/dL (ref >39)
LDL Chol Calc (NIH): 84 mg/dL (ref 0–99)
Triglycerides: 101 mg/dL (ref 0–149)
VLDL Cholesterol Cal: 18 mg/dL (ref 5–40)

## 2023-05-22 LAB — COMPREHENSIVE METABOLIC PANEL WITH GFR
ALT: 27 IU/L (ref 0–32)
AST: 23 IU/L (ref 0–40)
Albumin: 4.6 g/dL (ref 3.8–4.9)
Alkaline Phosphatase: 77 IU/L (ref 44–121)
BUN/Creatinine Ratio: 17 (ref 9–23)
BUN: 13 mg/dL (ref 6–24)
Bilirubin Total: 0.6 mg/dL (ref 0.0–1.2)
CO2: 27 mmol/L (ref 20–29)
Calcium: 9.3 mg/dL (ref 8.7–10.2)
Chloride: 97 mmol/L (ref 96–106)
Creatinine, Ser: 0.75 mg/dL (ref 0.57–1.00)
Globulin, Total: 2.1 g/dL (ref 1.5–4.5)
Glucose: 92 mg/dL (ref 70–99)
Potassium: 3.7 mmol/L (ref 3.5–5.2)
Sodium: 140 mmol/L (ref 134–144)
Total Protein: 6.7 g/dL (ref 6.0–8.5)
eGFR: 92 mL/min/{1.73_m2} (ref >59)

## 2023-05-22 LAB — MICROALBUMIN / CREATININE URINE RATIO
Creatinine, Urine: 256.2 mg/dL
Microalb/Creat Ratio: 8 mg/g{creat} (ref 0–29)
Microalbumin, Urine: 20.4 ug/mL

## 2023-05-22 LAB — TSH: TSH: 2.57 u[IU]/mL (ref 0.450–4.500)

## 2023-05-22 LAB — T4, FREE: Free T4: 1.3 ng/dL (ref 0.82–1.77)

## 2023-05-31 ENCOUNTER — Encounter: Payer: Self-pay | Admitting: Nurse Practitioner

## 2023-05-31 ENCOUNTER — Ambulatory Visit: Admitting: Nurse Practitioner

## 2023-05-31 VITALS — BP 118/66 | HR 80 | Temp 98.4°F | Ht 61.0 in | Wt 197.0 lb

## 2023-05-31 DIAGNOSIS — E669 Obesity, unspecified: Secondary | ICD-10-CM

## 2023-05-31 MED ORDER — PHENTERMINE HCL 37.5 MG PO TABS: ORAL_TABLET | ORAL | 2 refills | Status: DC

## 2023-05-31 NOTE — Progress Notes (Signed)
   Subjective:    Patient ID: Gabrielle Little, female    DOB: September 04, 1965, 58 y.o.   MRN: [id number]  HPI Presents for follow-up after starting phentermine  for weight loss.  Still taking a half tab per day which she states is working well.  Has made significant changes in her diet including drinking more water.  Very limited amount of sweet tea.  Limited carbs.  Has switched to more whole-grain foods such as bread.  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  Denies any adverse effects from phentermine .   Review of Systems  Respiratory:  Negative for cough, chest tightness and shortness of breath.   Cardiovascular:  Negative for chest pain and palpitations.  Psychiatric/Behavioral:  Negative for sleep disturbance.       05/31/2023    1:46 PM  Depression screen PHQ 2/9  Decreased Interest 0  Down, Depressed, Hopeless 0  PHQ - 2 Score 0  Altered sleeping 1  Tired, decreased energy 0  Change in appetite 0  Feeling bad or failure about yourself  0  Trouble concentrating 0  Moving slowly or fidgety/restless 0  Suicidal thoughts 0  PHQ-9 Score 1      05/31/2023    1:46 PM 05/03/2023    2:06 PM 12/13/2022   11:04 AM 11/11/2022    1:19 PM  GAD 7 : Generalized Anxiety Score  Nervous, Anxious, on Edge 0 1 0 1  Control/stop worrying 0 1 0 0  Worry too much - different things 0 0 0 0  Trouble relaxing 0 0 0 0  Restless 1 0 0 0  Easily annoyed or irritable 1 1 1 1   Afraid - awful might happen 0 0 0 0  Total GAD 7 Score 2 3 1 2   Anxiety Difficulty Not difficult at all Not difficult at all  Not difficult at all         Objective:   Physical Exam NAD.  Alert, oriented.  Lungs clear.  Heart regular rate rhythm. Today's Vitals   05/31/23 1345  BP: 118/66  Pulse: 80  Temp: 98.4 F (36.9 C)  SpO2: 100%  Weight: 197 lb (89.4 kg)  Height: 5\' 1"  (1.549 m)   Body mass index is 37.22 kg/m. Patient has lost 7 pounds in 1  month.       Assessment & Plan:   Problem List Items Addressed This Visit       Other   Obesity (BMI 30-39.9) - Primary   Relevant Medications   phentermine  (ADIPEX-P ) 37.5 MG tablet   Meds ordered this encounter  Medications   phentermine  (ADIPEX-P ) 37.5 MG tablet    Sig: Take one tab po qam for weight loss    Dispense:  30 tablet    Refill:  2    Supervising Provider:   Charlotta Cook A [[id number]]   Continue phentermine  as directed, may increase to 1 tablet if needed. Follow-up in 3 months if she wishes to continue medication, call back sooner if any problems.  Encouraged continued healthy lifestyle including activity and healthy diet.

## 2023-06-01 ENCOUNTER — Encounter: Payer: Self-pay | Admitting: Nurse Practitioner

## 2023-08-18 ENCOUNTER — Emergency Department (HOSPITAL_COMMUNITY)

## 2023-08-18 ENCOUNTER — Other Ambulatory Visit: Payer: Self-pay

## 2023-08-18 ENCOUNTER — Encounter (HOSPITAL_COMMUNITY): Payer: Self-pay | Admitting: Emergency Medicine

## 2023-08-18 ENCOUNTER — Emergency Department (HOSPITAL_COMMUNITY)
Admission: EM | Admit: 2023-08-18 | Discharge: 2023-08-18 | Disposition: A | Discharge status: Home / Self Care | Attending: Emergency Medicine | Admitting: Emergency Medicine

## 2023-08-18 DIAGNOSIS — E876 Hypokalemia: Secondary | ICD-10-CM | POA: Insufficient documentation

## 2023-08-18 DIAGNOSIS — I10 Essential (primary) hypertension: Secondary | ICD-10-CM | POA: Diagnosis not present

## 2023-08-18 DIAGNOSIS — R42 Dizziness and giddiness: Secondary | ICD-10-CM | POA: Diagnosis present

## 2023-08-18 DIAGNOSIS — H81399 Other peripheral vertigo, unspecified ear: Secondary | ICD-10-CM | POA: Diagnosis not present

## 2023-08-18 DIAGNOSIS — Z79899 Other long term (current) drug therapy: Secondary | ICD-10-CM | POA: Diagnosis not present

## 2023-08-18 LAB — COMPREHENSIVE METABOLIC PANEL WITH GFR
ALT: 34 U/L (ref 0–44)
AST: 24 U/L (ref 15–41)
Albumin: 3.7 g/dL (ref 3.5–5.0)
Alkaline Phosphatase: 64 U/L (ref 38–126)
Anion gap: 10 (ref 5–15)
BUN: 11 mg/dL (ref 6–20)
CO2: 25 mmol/L (ref 22–32)
Calcium: 8.8 mg/dL — ABNORMAL LOW (ref 8.9–10.3)
Chloride: 106 mmol/L (ref 98–111)
Creatinine, Ser: 0.68 mg/dL (ref 0.44–1.00)
GFR, Estimated: >60 mL/min (ref >60)
Glucose, Bld: 198 mg/dL — ABNORMAL HIGH (ref 70–99)
Potassium: 2.6 mmol/L — CL (ref 3.5–5.1)
Sodium: 141 mmol/L (ref 135–145)
Total Bilirubin: 0.7 mg/dL (ref 0.0–1.2)
Total Protein: 6.9 g/dL (ref 6.5–8.1)

## 2023-08-18 LAB — CBC
HCT: 43.8 % (ref 36.0–46.0)
Hemoglobin: 14.8 g/dL (ref 12.0–15.0)
MCH: 29.9 pg (ref 26.0–34.0)
MCHC: 33.8 g/dL (ref 30.0–36.0)
MCV: 88.5 fL (ref 80.0–100.0)
Platelets: 274 K/uL (ref 150–400)
RBC: 4.95 MIL/uL (ref 3.87–5.11)
RDW: 13.8 % (ref 11.5–15.5)
WBC: 10.5 K/uL (ref 4.0–10.5)
nRBC: 0 % (ref 0.0–0.2)

## 2023-08-18 LAB — LIPASE, BLOOD: Lipase: 24 U/L (ref 11–51)

## 2023-08-18 MED ORDER — DIAZEPAM 5 MG/ML IJ SOLN
5.0000 mg | Freq: Once | INTRAMUSCULAR | Status: AC
  Administered 2023-08-18: 5 mg via INTRAVENOUS
  Filled 2023-08-18: qty 2

## 2023-08-18 MED ORDER — ONDANSETRON HCL 4 MG PO TABS: 4.0000 mg | ORAL_TABLET | Freq: Four times a day (QID) | ORAL | 0 refills | Status: AC

## 2023-08-18 MED ORDER — POTASSIUM CHLORIDE 10 MEQ/100ML IV SOLN
10.0000 meq | Freq: Once | INTRAVENOUS | Status: AC
  Administered 2023-08-18: 10 meq via INTRAVENOUS
  Filled 2023-08-18: qty 100

## 2023-08-18 MED ORDER — POTASSIUM CHLORIDE CRYS ER 20 MEQ PO TBCR
40.0000 meq | EXTENDED_RELEASE_TABLET | Freq: Once | ORAL | Status: AC
  Administered 2023-08-18: 40 meq via ORAL
  Filled 2023-08-18: qty 2

## 2023-08-18 MED ORDER — POTASSIUM CHLORIDE ER 10 MEQ PO TBCR: 10.0000 meq | EXTENDED_RELEASE_TABLET | Freq: Every day | ORAL | 0 refills | Status: DC

## 2023-08-18 MED ORDER — SODIUM CHLORIDE 0.9 % IV BOLUS
1000.0000 mL | Freq: Once | INTRAVENOUS | Status: AC
  Administered 2023-08-18: 1000 mL via INTRAVENOUS

## 2023-08-18 MED ORDER — MECLIZINE HCL 25 MG PO TABS: 25.0000 mg | ORAL_TABLET | Freq: Three times a day (TID) | ORAL | 0 refills | Status: AC | PRN

## 2023-08-18 NOTE — ED Triage Notes (Signed)
 Pt bib EMS with c/o multiple episodes of emesis x 2 hrs. EMS gave 4mg  Zofran  IV en route. Pt also c/o dizziness that started prior to emesis.

## 2023-08-18 NOTE — ED Notes (Addendum)
 Pt was capable of sitting up on side of bed without nausea. She stated it was easier to sit up and not open her eyes. She stated that when she opened her eyes, she got dizzy. Pt attempted to stood up  but when she stood pt became nauseated and dry heaved.

## 2023-08-18 NOTE — ED Provider Notes (Signed)
 [place] EMERGENCY DEPARTMENT AT [facility] Provider Note   CSN: [id number] Arrival date & time: 08/18/23  9461     Patient presents with: Emesis and Dizziness   Gabrielle Little is a 58 y.o. female.   Patient is a 58 year old female with past medical history of hypertension, hyperlipidemia, anxiety.  Patient presenting today for evaluation of dizziness and vomiting.  She started yesterday with a sensation of dizziness like the room was spinning which worsened in the night.  She woke up vomiting repeatedly with much worse symptoms.  She denies any weakness or numbness.  She denies any headache.  Symptoms are worse with movement and changing position.  They are relieved slightly by closing her eyes and remaining still.       Prior to Admission medications   Medication Sig Start Date End Date Taking? Authorizing Provider  amLODipine  (NORVASC ) 10 MG tablet Take 1 tablet (10 mg total) by mouth at bedtime. 08/19/22   Cook, Jayce G, DO  FLUoxetine  (PROZAC ) 40 MG capsule TAKE ONE CAPSULE (40MG  TOTAL) BY MOUTH DAILY 08/19/22   Cook, Jayce G, DO  hydrochlorothiazide  (HYDRODIURIL ) 25 MG tablet Take 1 tablet (25 mg total) by mouth daily. Prn swelling 05/03/23   Mauro Elveria BROCKS, NP  Lysine 500 MG TABS Take 500 mg by mouth daily.    [provider]  Multiple Vitamin (MULTIVITAMIN WITH MINERALS) TABS tablet Take 1 tablet by mouth daily.    [provider]  norethindrone  (MICRONOR ) 0.35 MG tablet TAKE ONE (1) TABLET BY MOUTH EVERY DAY 03/15/23   Signa Delon LABOR, NP  phentermine  (ADIPEX-P ) 37.5 MG tablet Take one tab po qam for weight loss 05/31/23   Mauro Elveria BROCKS, NP  pravastatin  (PRAVACHOL ) 20 MG tablet Take 1 tablet (20 mg total) by mouth at bedtime. 08/19/22   Cook, Jayce G, DO  Turmeric (QC TUMERIC COMPLEX PO) Take by mouth.    [provider]    Allergies: Biaxin [clarithromycin] and Clindamycin/lincomycin    Review of Systems  All other systems  reviewed and are negative.   Updated Vital Signs BP 119/70 (BP Location: Right Arm)   Pulse 80   Temp 97.6 F (36.4 C) (Oral)   Resp 20   Ht 5' 1 (1.549 m)   Wt 86.2 kg   LMP 01/29/2022 (Approximate)   SpO2 92%   BMI 35.90 kg/m   Physical Exam Vitals and nursing note reviewed.  Constitutional:      General: She is not in acute distress.    Appearance: She is well-developed. She is not diaphoretic.     Comments: Patient actively retching at the time of my evaluation.  HENT:     Head: Normocephalic and atraumatic.  Eyes:     Extraocular Movements: Extraocular movements intact.     Pupils: Pupils are equal, round, and reactive to light.  Cardiovascular:     Rate and Rhythm: Normal rate and regular rhythm.     Heart sounds: No murmur heard.    No friction rub. No gallop.  Pulmonary:     Effort: Pulmonary effort is normal. No respiratory distress.     Breath sounds: Normal breath sounds. No wheezing.  Abdominal:     General: Bowel sounds are normal. There is no distension.     Palpations: Abdomen is soft.     Tenderness: There is no abdominal tenderness.  Musculoskeletal:        General: Normal range of motion.     Cervical  back: Normal range of motion and neck supple.  Skin:    General: Skin is warm and dry.  Neurological:     General: No focal deficit present.     Mental Status: She is alert and oriented to person, place, and time.     Cranial Nerves: No cranial nerve deficit.     Motor: No weakness.     (all labs ordered are listed, but only abnormal results are displayed) Labs Reviewed  LIPASE, BLOOD  COMPREHENSIVE METABOLIC PANEL WITH GFR  CBC  URINALYSIS, ROUTINE W REFLEX MICROSCOPIC    EKG: EKG Interpretation Date/Time:  Thursday August 18 2023 05:48:39 EDT Ventricular Rate:  79 PR Interval:  157 QRS Duration:  112 QT Interval:  436 QTC Calculation: 500 R Axis:   21  Text Interpretation: Sinus rhythm Borderline intraventricular conduction delay  Borderline T abnormalities, anterior leads Borderline prolonged QT interval No significant change since 04/15/2021 Confirmed by Geroldine Berg ([id number]) on 08/18/2023 5:50:58 AM  Radiology: No results found.   Procedures   Medications Ordered in the ED  sodium chloride  0.9 % bolus 1,000 mL (has no administration in time range)  diazepam  (VALIUM ) injection 5 mg (has no administration in time range)                                    Medical Decision Making Amount and/or Complexity of Data Reviewed Labs: ordered. Radiology: ordered.  Risk Prescription drug management.   Patient is a 58 year old female presenting with dizziness, nausea, and vomiting starting yesterday, but worsening in the night.  Patient arrives here actively vomiting.  Her physical examination somewhat difficult secondary to her vomiting, but appears neurologically intact.  Laboratory studies obtained including CBC, comprehensive metabolic panel,, and lipase, all of which are unremarkable with the exception of a potassium of 2.6.  CT scan of the head is unremarkable.  Patient has been given IV fluids along with Valium .  She has also been given intravenous potassium.  I highly suspect symptoms are related to a peripheral vertigo.  Care will be signed out to Dr. Cleotilde at shift change for reassessment and response to medications.     Final diagnoses:  None    ED Discharge Orders     None          Geroldine Berg, MD 08/18/23 2309

## 2023-08-18 NOTE — ED Notes (Signed)
 Patient transported to CT

## 2023-08-18 NOTE — ED Provider Notes (Signed)
 This patient is a 58 year old female presenting with a complaint of dizziness which she describes as a room spinning and vertigo type feeling.  This occurred overnight, worse with movement, better when she lays still, right now when she is laying still in the bed she has no symptoms.  She was found to have hypokalemia to 2.6, CT scan was negative, vital signs have been normal.  She has no neurologic findings on my exam she is totally stable.  She will be repleted with her potassium, given IV fluids and discharged home.  I did talk to the family as well as the patient and they are agreeable to help out at home.  The patient has received IV fluids, potassium replacement and will be discharged with meclizine  Zofran  and potassium for another week.  She can follow-up with her family doctor, she is agreeable.  While she has been here she has not had improvement in her ability to get up to a bedside commode.  She understands that this may last for several days or even a week longer   Cleotilde Rogue, MD 08/18/23 1023

## 2023-08-18 NOTE — Discharge Instructions (Addendum)
 This type of vertigo is usually short-lived lasting hours to days but can go on longer.  I would recommend that you stay at home resting in bed is much as possible for the next couple of days until this completely goes away.  If you do have to get out of bed moving very slowly to change positions to minimize the amount of vertigo that you have.  I have given you 2 different prescriptions for vertigo, one is called meclizine , take 1 tablet every 8 hours as needed, this is an antihistamine and it can make you sleepy.  Potassium once a day for 7 days and have your family doctor recheck your level in 1 week  Zofran  is for nausea  Zofran  is a medication which can help with nausea.  You may take 4 mg by mouth every 6 hours as needed if you are an adult, if your child under the age of 6 take half of a tablet or 2 mg every 6 hours as needed.  This should dissolve on your tongue within a short timeframe.  Wait about 30 minutes after taking it to help with drinking clear liquids.  Thank you for allowing us  to treat you in the emergency department today.  After reviewing your examination and potential testing that was done it appears that you are safe to go home.  I would like for you to follow-up with your doctor within the next several days, have them obtain your records and follow-up with them to review all potential tests and results from your visit.  If you should develop severe or worsening symptoms return to the emergency department immediately

## 2023-08-24 ENCOUNTER — Inpatient Hospital Stay: Admitting: Family Medicine

## 2023-08-25 ENCOUNTER — Ambulatory Visit: Admitting: Nurse Practitioner

## 2023-08-25 VITALS — BP 124/83 | HR 92 | Temp 98.1°F | Ht 61.0 in | Wt 192.0 lb

## 2023-08-25 DIAGNOSIS — R42 Dizziness and giddiness: Secondary | ICD-10-CM | POA: Diagnosis not present

## 2023-08-25 DIAGNOSIS — E876 Hypokalemia: Secondary | ICD-10-CM

## 2023-08-27 ENCOUNTER — Encounter: Payer: Self-pay | Admitting: Nurse Practitioner

## 2023-08-27 DIAGNOSIS — R42 Dizziness and giddiness: Secondary | ICD-10-CM | POA: Insufficient documentation

## 2023-08-27 DIAGNOSIS — E876 Hypokalemia: Secondary | ICD-10-CM | POA: Insufficient documentation

## 2023-08-27 MED ORDER — POTASSIUM CHLORIDE ER 10 MEQ PO TBCR: 10.0000 meq | EXTENDED_RELEASE_TABLET | Freq: Every day | ORAL | 0 refills | Status: AC

## 2023-08-27 NOTE — Progress Notes (Signed)
 Subjective:    Patient ID: Gabrielle Little, female    DOB: [birth year], 58 y.o.   MRN: [id number]  HPI Presents for recheck after visit to local ED on 08/18/2023 for vertigo.  At that time patient had a low potassium.  Was given a bolus at the ED.  Remainder of testing was normal.  States she normally takes her hydrochlorothiazide  every other day but due to some swelling related to the hot weather has been taking it daily for the past 2 weeks.  States she had been working outside prior to this and was soaked in sweat.  States she was trying to drink enough fluids.  Symptoms overall improving.  States she is doing well overall on her current medication regimen including fluoxetine  40 mg daily.  Is currently on potassium 10 mEq daily.   Review of Systems  Constitutional:  Positive for fatigue.  Respiratory:  Negative for cough, chest tightness and shortness of breath.   Cardiovascular:  Negative for chest pain and leg swelling.  Neurological:  Positive for light-headedness. Negative for syncope, facial asymmetry, speech difficulty, weakness and numbness.      08/25/2023    3:42 PM  Depression screen PHQ 2/9  Decreased Interest 1  Down, Depressed, Hopeless 1  PHQ - 2 Score 2  Altered sleeping 1  Tired, decreased energy 1  Change in appetite 1  Feeling bad or failure about yourself  0  Trouble concentrating 1  Moving slowly or fidgety/restless 1  Suicidal thoughts 0  PHQ-9 Score 7  Difficult doing work/chores Somewhat difficult      08/25/2023    3:43 PM 05/31/2023    1:46 PM 05/03/2023    2:06 PM 12/13/2022   11:04 AM  GAD 7 : Generalized Anxiety Score  Nervous, Anxious, on Edge 1 0 1 0  Control/stop worrying 1 0 1 0  Worry too much - different things 0 0 0 0  Trouble relaxing 1 0 0 0  Restless 0 1 0 0  Easily annoyed or irritable 0 1 1 1   Afraid - awful might happen 1 0 0 0  Total GAD 7 Score 4 2 3 1   Anxiety Difficulty Not difficult at all Not difficult at all Not difficult  at all     Social History   Tobacco Use   Smoking status: Never   Smokeless tobacco: Never  Vaping Use   Vaping status: Never Used  Substance Use Topics   Alcohol use: No   Drug use: No        Objective:   Physical Exam Vitals and nursing note reviewed.  Constitutional:      General: She is not in acute distress. Cardiovascular:     Rate and Rhythm: Normal rate and regular rhythm.     Heart sounds: Normal heart sounds.  Pulmonary:     Effort: Pulmonary effort is normal.     Breath sounds: Normal breath sounds.  Musculoskeletal:     Right lower leg: No edema.     Left lower leg: No edema.  Skin:    General: Skin is warm and dry.  Neurological:     Mental Status: She is alert and oriented to person, place, and time.     Motor: No weakness.     Gait: Gait normal.  Psychiatric:        Mood and Affect: Mood normal.        Behavior: Behavior normal.        Thought  Content: Thought content normal.        Judgment: Judgment normal.   Balance and gait normal.  Gets on and off exam table without assistance.  08/18/2023 potassium level was 2.6. Today's Vitals   08/25/23 1538  BP: 124/83  Pulse: 92  Temp: 98.1 F (36.7 C)  SpO2: 95%  Weight: 192 lb (87.1 kg)  Height: 5' 1 (1.549 m)   Body mass index is 36.28 kg/m.         Assessment & Plan:   Problem List Items Addressed This Visit       Other   Acute hypokalemia - Primary   Relevant Orders   Potassium   Vertigo     Repeat serum potassium ordered. ED note reviewed.  Will contact patient and have her take potassium 10 mEq on any days that she takes her HCTZ.  She is increased her fluid intake including rehydration drinks especially when she is getting hot or sweating. Symptoms are overall slowly improving.  Warning signs reviewed.  Call back if any problems.

## 2023-08-30 LAB — POTASSIUM: Potassium: 4 mmol/L (ref 3.5–5.2)

## 2023-08-31 ENCOUNTER — Ambulatory Visit: Payer: Self-pay | Admitting: Nurse Practitioner

## 2023-09-02 ENCOUNTER — Ambulatory Visit: Admitting: Nurse Practitioner

## 2023-09-23 ENCOUNTER — Other Ambulatory Visit: Payer: Self-pay | Admitting: Family Medicine

## 2023-09-23 DIAGNOSIS — I10 Essential (primary) hypertension: Secondary | ICD-10-CM

## 2023-10-25 ENCOUNTER — Other Ambulatory Visit: Payer: Self-pay | Admitting: Family Medicine

## 2023-10-25 DIAGNOSIS — E785 Hyperlipidemia, unspecified: Secondary | ICD-10-CM

## 2023-12-01 ENCOUNTER — Other Ambulatory Visit: Payer: Self-pay | Admitting: Family Medicine

## 2023-12-01 DIAGNOSIS — F419 Anxiety disorder, unspecified: Secondary | ICD-10-CM

## 2024-01-22 ENCOUNTER — Encounter: Payer: Self-pay | Admitting: Nurse Practitioner

## 2024-01-23 ENCOUNTER — Other Ambulatory Visit: Payer: Self-pay | Admitting: Family Medicine
